# Patient Record
Sex: Male | Born: 1950 | Race: Black or African American | Hispanic: No | Smoking: Former smoker
Health system: Southern US, Community
[De-identification: ages and names within clinical notes are randomized; demographics above are authoritative.]

## PROBLEM LIST (undated history)

## (undated) DIAGNOSIS — M1990 Unspecified osteoarthritis, unspecified site: Secondary | ICD-10-CM

## (undated) DIAGNOSIS — I10 Essential (primary) hypertension: Secondary | ICD-10-CM

## (undated) DIAGNOSIS — E119 Type 2 diabetes mellitus without complications: Secondary | ICD-10-CM

## (undated) DIAGNOSIS — K4090 Unilateral inguinal hernia, without obstruction or gangrene, not specified as recurrent: Secondary | ICD-10-CM

## (undated) DIAGNOSIS — E785 Hyperlipidemia, unspecified: Secondary | ICD-10-CM

## (undated) DIAGNOSIS — M109 Gout, unspecified: Secondary | ICD-10-CM

## (undated) HISTORY — DX: Unspecified osteoarthritis, unspecified site: M19.90

## (undated) HISTORY — DX: Essential (primary) hypertension: I10

## (undated) HISTORY — DX: Hyperlipidemia, unspecified: E78.5

## (undated) HISTORY — DX: Gout, unspecified: M10.9

## (undated) HISTORY — DX: Type 2 diabetes mellitus without complications: E11.9

---

## 2006-06-09 ENCOUNTER — Encounter: Admission: RE | Admit: 2006-06-09 | Discharge: 2006-06-09 | Payer: Self-pay | Admitting: Family Medicine

## 2006-09-24 ENCOUNTER — Encounter: Admission: RE | Admit: 2006-09-24 | Discharge: 2006-09-24 | Payer: Self-pay | Admitting: Family Medicine

## 2011-06-01 ENCOUNTER — Ambulatory Visit (INDEPENDENT_AMBULATORY_CARE_PROVIDER_SITE_OTHER): Payer: Managed Care, Other (non HMO)

## 2011-06-01 DIAGNOSIS — M79609 Pain in unspecified limb: Secondary | ICD-10-CM

## 2011-06-01 DIAGNOSIS — I10 Essential (primary) hypertension: Secondary | ICD-10-CM

## 2011-12-08 ENCOUNTER — Encounter: Payer: Self-pay | Admitting: Internal Medicine

## 2012-01-05 ENCOUNTER — Ambulatory Visit (AMBULATORY_SURGERY_CENTER): Payer: Self-pay

## 2012-01-05 ENCOUNTER — Encounter: Payer: Self-pay | Admitting: Internal Medicine

## 2012-01-05 VITALS — Ht 71.0 in | Wt 210.0 lb

## 2012-01-05 DIAGNOSIS — Z1211 Encounter for screening for malignant neoplasm of colon: Secondary | ICD-10-CM

## 2012-01-05 MED ORDER — MOVIPREP 100 G PO SOLR: 1.0000 | Freq: Once | ORAL | Status: DC

## 2012-01-06 ENCOUNTER — Ambulatory Visit (INDEPENDENT_AMBULATORY_CARE_PROVIDER_SITE_OTHER): Payer: Managed Care, Other (non HMO) | Admitting: Emergency Medicine

## 2012-01-06 VITALS — BP 118/72 | HR 81 | Temp 98.7°F | Resp 18 | Ht 70.0 in | Wt 208.0 lb

## 2012-01-06 DIAGNOSIS — M109 Gout, unspecified: Secondary | ICD-10-CM

## 2012-01-06 MED ORDER — COLCHICINE 0.6 MG PO TABS: 0.6000 mg | ORAL_TABLET | Freq: Every day | ORAL | Status: DC

## 2012-01-06 NOTE — Progress Notes (Signed)
   Date:  01/06/2012   Name:  Curtis Allison   DOB:  06-29-1950   MRN:  [id number]  PCP:  Gaspar Garbe, MD    Chief Complaint: Gout   History of Present Illness:  Curtis Allison is a 61 y.o. very pleasant male patient who presents with the following:  History of gout and usually responds well to colcrys.  Has pain, redness, and swelling in right wrist.  No history of injury or fever or chills.  No deformity.  There is no problem list on file for this patient.   Past Medical History  Diagnosis Date  . Hypertension     diagnosed 2006  . Diabetes mellitus     type II ; diagnosed 2006  . Gout     No past surgical history on file.  History  Substance Use Topics  . Smoking status: Former Smoker    Quit date: 01/04/2009  . Smokeless tobacco: Never Used  . Alcohol Use: Not on file    Family History  Problem Relation Age of Onset  . Colon cancer Maternal Grandfather     No Known Allergies  Medication list has been reviewed and updated.  Current Outpatient Prescriptions on File Prior to Visit  Medication Sig Dispense Refill  . amLODipine (NORVASC) 10 MG tablet Take 10 mg by mouth daily.      Marland Kitchen atenolol (TENORMIN) 50 MG tablet Take 50 mg by mouth daily.      . colchicine 0.6 MG tablet Take 0.6 mg by mouth daily.      Marland Kitchen lisinopril (PRINIVIL,ZESTRIL) 20 MG tablet Take 20 mg by mouth daily.      . metFORMIN (GLUCOPHAGE) 500 MG tablet Take 500 mg by mouth 2 (two) times daily with a meal.      . TRILIPIX 135 MG capsule       . MOVIPREP 100 G SOLR Take 1 kit (100 g total) by mouth once.  1 kit  0    Review of Systems:  As per HPI, otherwise negative.    Physical Examination: Filed Vitals:   01/06/12 1724  BP: 118/72  Pulse: 81  Temp: 98.7 F (37.1 C)  Resp: 18   Filed Vitals:   01/06/12 1724  Height: 5\' 10"  (1.778 m)  Weight: 208 lb (94.348 kg)   Body mass index is 29.84 kg/(m^2). Ideal Body Weight: Weight in (lb) to have BMI = 25: 173.9    GEN:  WDWN, NAD, Non-toxic, Alert & Oriented x 3 HEENT: Atraumatic, Normocephalic.  Ears and Nose: No external deformity. EXTR: No clubbing/cyanosis/edema.  Right wrist swollen, red, guards, and very tender.  NATI.  No deformity NEURO: Normal gait.  PSYCH: Normally interactive. Conversant. Not depressed or anxious appearing.  Calm demeanor.    Assessment and Plan: Gout exacerbation colcrys  Carmelina Dane, MD

## 2012-01-06 NOTE — Progress Notes (Deleted)
  Subjective:    Patient ID: Curtis Allison, male    DOB: 12/24/1950, 61 y.o.   MRN: [id number]  HPI    Review of Systems     Objective:   Physical Exam        Assessment & Plan:

## 2012-01-06 NOTE — Patient Instructions (Addendum)
gGout Gout is an inflammatory condition (arthritis) caused by a buildup of uric acid crystals in the joints. Uric acid is a chemical that is normally present in the blood. Under some circumstances, uric acid can form into crystals in your joints. This causes joint redness, soreness, and swelling (inflammation). Repeat attacks are common. Over time, uric acid crystals can form into masses (tophi) near a joint, causing disfigurement. Gout is treatable and often preventable. CAUSES  The disease begins with elevated levels of uric acid in the blood. Uric acid is produced by your body when it breaks down a naturally found substance called purines. This also happens when you eat certain foods such as meats and fish. Causes of an elevated uric acid level include:  Being passed down from parent to child (heredity).   Diseases that cause increased uric acid production (obesity, psoriasis, some cancers).   Excessive alcohol use.   Diet, especially diets rich in meat and seafood.   Medicines, including certain cancer-fighting drugs (chemotherapy), diuretics, and aspirin.   Chronic kidney disease. The kidneys are no longer able to remove uric acid well.   Problems with metabolism.  Conditions strongly associated with gout include:  Obesity.   High blood pressure.   High cholesterol.   Diabetes.  Not everyone with elevated uric acid levels gets gout. It is not understood why some people get gout and others do not. Surgery, joint injury, and eating too much of certain foods are some of the factors that can lead to gout. SYMPTOMS   An attack of gout comes on quickly. It causes intense pain with redness, swelling, and warmth in a joint.   Fever can occur.   Often, only one joint is involved. Certain joints are more commonly involved:   Base of the big toe.   Knee.   Ankle.   Wrist.   Finger.  Without treatment, an attack usually goes away in a few days to weeks. Between attacks, you  usually will not have symptoms, which is different from many other forms of arthritis. DIAGNOSIS  Your caregiver will suspect gout based on your symptoms and exam. Removal of fluid from the joint (arthrocentesis) is done to check for uric acid crystals. Your caregiver will give you a medicine that numbs the area (local anesthetic) and use a needle to remove joint fluid for exam. Gout is confirmed when uric acid crystals are seen in joint fluid, using a special microscope. Sometimes, blood, urine, and X-ray tests are also used. TREATMENT  There are 2 phases to gout treatment: treating the sudden onset (acute) attack and preventing attacks (prophylaxis). Treatment of an Acute Attack  Medicines are used. These include anti-inflammatory medicines or steroid medicines.   An injection of steroid medicine into the affected joint is sometimes necessary.   The painful joint is rested. Movement can worsen the arthritis.   You may use warm or cold treatments on painful joints, depending which works best for you.   Discuss the use of coffee, vitamin C, or cherries with your caregiver. These may be helpful treatment options.  Treatment to Prevent Attacks After the acute attack subsides, your caregiver may advise prophylactic medicine. These medicines either help your kidneys eliminate uric acid from your body or decrease your uric acid production. You may need to stay on these medicines for a very long time. The early phase of treatment with prophylactic medicine can be associated with an increase in acute gout attacks. For this reason, during the first few months  of treatment, your caregiver may also advise you to take medicines usually used for acute gout treatment. Be sure you understand your caregiver's directions. You should also discuss dietary treatment with your caregiver. Certain foods such as meats and fish can increase uric acid levels. Other foods such as dairy can decrease levels. Your caregiver  can give you a list of foods to avoid. HOME CARE INSTRUCTIONS   Do not take aspirin to relieve pain. This raises uric acid levels.   Only take over-the-counter or prescription medicines for pain, discomfort, or fever as directed by your caregiver.   Rest the joint as much as possible. When in bed, keep sheets and blankets off painful areas.   Keep the affected joint raised (elevated).   Use crutches if the painful joint is in your leg.   Drink enough water and fluids to keep your urine clear or pale yellow. This helps your body get rid of uric acid. Do not drink alcoholic beverages. They slow the passage of uric acid.   Follow your caregiver's dietary instructions. Pay careful attention to the amount of protein you eat. Your daily diet should emphasize fruits, vegetables, whole grains, and fat-free or low-fat milk products.   Maintain a healthy body weight.  SEEK MEDICAL CARE IF:   You have an oral temperature above 102 F (38.9 C).   You develop diarrhea, vomiting, or any side effects from medicines.   You do not feel better in 24 hours, or you are getting worse.  SEEK IMMEDIATE MEDICAL CARE IF:   Your joint becomes suddenly more tender and you have:   Chills.   An oral temperature above 102 F (38.9 C), not controlled by medicine.  MAKE SURE YOU:   Understand these instructions.   Will watch your condition.   Will get help right away if you are not doing well or get worse.  Document Released: 05/28/2000 Document Revised: 05/20/2011 Document Reviewed: 09/08/2009 Exit[employer] Patient Information 2012 [employer]. 

## 2012-01-19 ENCOUNTER — Encounter: Payer: Self-pay | Admitting: Internal Medicine

## 2012-01-21 ENCOUNTER — Ambulatory Visit (AMBULATORY_SURGERY_CENTER): Payer: Managed Care, Other (non HMO) | Admitting: Internal Medicine

## 2012-01-21 ENCOUNTER — Encounter: Payer: Self-pay | Admitting: Internal Medicine

## 2012-01-21 VITALS — BP 160/94 | HR 85 | Temp 97.0°F | Resp 20 | Ht 71.0 in | Wt 210.0 lb

## 2012-01-21 DIAGNOSIS — D126 Benign neoplasm of colon, unspecified: Secondary | ICD-10-CM

## 2012-01-21 DIAGNOSIS — Z1211 Encounter for screening for malignant neoplasm of colon: Secondary | ICD-10-CM

## 2012-01-21 MED ORDER — SODIUM CHLORIDE 0.9 % IV SOLN: 500.0000 mL | INTRAVENOUS | Status: DC

## 2012-01-21 NOTE — Progress Notes (Signed)
Patient did not experience any of the following events: a burn prior to discharge; a fall within the facility; wrong site/side/patient/procedure/implant event; or a hospital transfer or hospital admission upon discharge from the facility. (G8907) Patient did not have preoperative order for IV antibiotic SSI prophylaxis. (G8918)  

## 2012-01-21 NOTE — Op Note (Signed)
[facility] [address] [place], [place]  [address]  COLONOSCOPY PROCEDURE REPORT  PATIENT:  Curtis Allison, Curtis Allison  MR#:  [id number] BIRTHDATE:  17-Feb-1951, 60 yrs. old  GENDER:  male ENDOSCOPIST:  Iva Boop, MD, [facility] REF. BY:  Guerry Bruin, M.D. PROCEDURE DATE:  01/21/2012 PROCEDURE:  Colonoscopy with snare polypectomy ASA CLASS:  Class II INDICATIONS:  Routine Risk Screening MEDICATIONS:   These medications were titrated to patient response per physician's verbal order, MAC sedation, administered by CRNA, propofol (Diprivan) 300 mg IV  DESCRIPTION OF PROCEDURE:   After the risks benefits and alternatives of the procedure were thoroughly explained, informed consent was obtained.  Digital rectal exam was performed and revealed no abnormalities and normal prostate.   The LB [id number] endoscope was introduced through the anus and advanced to the cecum, which was identified by both the appendix and ileocecal valve, without limitations.  The quality of the prep was excellent, using MoviPrep.  The instrument was then slowly withdrawn as the colon was fully examined. <<PROCEDUREIMAGES>>  FINDINGS:  A diminutive polyp was found in the descending colon. It was 4 mm in size.  This was otherwise a normal examination of the colon. Includes right colon retroflexion.   Retroflexed views in the rectum revealed no abnormalities.    The time to cecum = 1:37 minutes. The scope was then withdrawn in 10:57 minutes from the cecum and the procedure completed. COMPLICATIONS:  None ENDOSCOPIC IMPRESSION: 1) 4 mm diminutive polyp in the descending colon - removed 2) Otherwise normal examination - excellent prep  REPEAT EXAM:  In for Colonoscopy, pending biopsy results.  Iva Boop, MD, [facility]  CC:  Guerry Bruin, MD and The Patient  n. eSIGNED:   Iva Boop at 01/21/2012 02:06 PM  Charlaine Dalton, [phone]

## 2012-01-21 NOTE — Patient Instructions (Addendum)
One small polyp was removed. It looks benign. The colon was otherwise normal.  I will send a letter about the results and recommendations.  Thank you for choosing me and [facility].  Iva Boop, MD, FACG  YOU HAD AN ENDOSCOPIC PROCEDURE TODAY AT THE  ENDOSCOPY CENTER: Refer to the procedure report that was given to you for any specific questions about what was found during the examination.  If the procedure report does not answer your questions, please call your gastroenterologist to clarify.  If you requested that your care partner not be given the details of your procedure findings, then the procedure report has been included in a sealed envelope for you to review at your convenience later.  YOU SHOULD EXPECT: Some feelings of bloating in the abdomen. Passage of more gas than usual.  Walking can help get rid of the air that was put into your GI tract during the procedure and reduce the bloating. If you had a lower endoscopy (such as a colonoscopy or flexible sigmoidoscopy) you may notice spotting of blood in your stool or on the toilet paper. If you underwent a bowel prep for your procedure, then you may not have a normal bowel movement for a few days.  DIET: Your first meal following the procedure should be a light meal and then it is ok to progress to your normal diet.  A half-sandwich or bowl of soup is an example of a good first meal.  Heavy or fried foods are harder to digest and may make you feel nauseous or bloated.  Likewise meals heavy in dairy and vegetables can cause extra gas to form and this can also increase the bloating.  Drink plenty of fluids but you should avoid alcoholic beverages for 24 hours.  ACTIVITY: Your care partner should take you home directly after the procedure.  You should plan to take it easy, moving slowly for the rest of the day.  You can resume normal activity the day after the procedure however you should NOT DRIVE or use heavy machinery  for 24 hours (because of the sedation medicines used during the test).    SYMPTOMS TO REPORT IMMEDIATELY: A gastroenterologist can be reached at any hour.  During normal business hours, 8:30 AM to 5:00 PM Monday through Friday, call [phone].  After hours and on weekends, please call the GI answering service at [phone] who will take a message and have the physician on call contact you.   Following lower endoscopy (colonoscopy or flexible sigmoidoscopy):  Excessive amounts of blood in the stool  Significant tenderness or worsening of abdominal pains  Swelling of the abdomen that is new, acute  Fever of 100F or higher  Following upper endoscopy (EGD)  Vomiting of blood or coffee ground material  New chest pain or pain under the shoulder blades  Painful or persistently difficult swallowing  New shortness of breath  Fever of 100F or higher  Black, tarry-looking stools  FOLLOW UP: If any biopsies were taken you will be contacted by phone or by letter within the next 1-3 weeks.  Call your gastroenterologist if you have not heard about the biopsies in 3 weeks.  Our staff will call the home number listed on your records the next business day following your procedure to check on you and address any questions or concerns that you may have at that time regarding the information given to you following your procedure. This is a courtesy call and so if there  is no answer at the home number and we have not heard from you through the emergency physician on call, we will assume that you have returned to your regular daily activities without incident.  SIGNATURES/CONFIDENTIALITY: You and/or your care partner have signed paperwork which will be entered into your electronic medical record.  These signatures attest to the fact that that the information above on your After Visit Summary has been reviewed and is understood.  Full responsibility of the confidentiality of this discharge information  lies with you and/or your care-partner.   HANDOUTS ON POLYPS

## 2012-01-24 ENCOUNTER — Telehealth: Payer: Self-pay | Admitting: *Deleted

## 2012-01-24 NOTE — Telephone Encounter (Signed)
  Follow up Call-  Call back number 01/21/2012  Post procedure Call Back phone  # [phone]  Permission to leave phone message Yes     Patient questions:  Message left to call us if necessary.

## 2012-01-26 ENCOUNTER — Encounter: Payer: Self-pay | Admitting: Internal Medicine

## 2012-01-26 DIAGNOSIS — Z860100 Personal history of colon polyps, unspecified: Secondary | ICD-10-CM | POA: Insufficient documentation

## 2012-01-26 NOTE — Progress Notes (Signed)
Quick Note:  Diminutive adenoma  Repeat colonoscopy about 01/2017 ______

## 2012-03-17 ENCOUNTER — Other Ambulatory Visit: Payer: Self-pay | Admitting: Emergency Medicine

## 2013-03-20 ENCOUNTER — Ambulatory Visit (INDEPENDENT_AMBULATORY_CARE_PROVIDER_SITE_OTHER): Payer: Commercial Indemnity | Admitting: General Surgery

## 2013-03-20 ENCOUNTER — Encounter (INDEPENDENT_AMBULATORY_CARE_PROVIDER_SITE_OTHER): Payer: Self-pay | Admitting: General Surgery

## 2013-03-20 ENCOUNTER — Telehealth (INDEPENDENT_AMBULATORY_CARE_PROVIDER_SITE_OTHER): Payer: Self-pay | Admitting: General Surgery

## 2013-03-20 VITALS — BP 164/96 | HR 85 | Temp 98.2°F | Ht 71.0 in | Wt 220.0 lb

## 2013-03-20 DIAGNOSIS — K4090 Unilateral inguinal hernia, without obstruction or gangrene, not specified as recurrent: Secondary | ICD-10-CM

## 2013-03-20 NOTE — Progress Notes (Signed)
Patient ID: Curtis Allison, male   DOB: 11-24-1950, 62 y.o.   MRN: [id number]  Chief Complaint  Patient presents with  . New Evaluation    eval RIH    HPI Curtis Allison is a 62 y.o. male.  The patient is a 62 year old male who is referred by Dr. Wylene Simmer for evaluation of a right inguinal hernia. The patient states he feels she's had this for approximately one to 2 years. He states becoming more discomforting. 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  HPI  Past Medical History  Diagnosis Date  . Hypertension     diagnosed 2006  . Diabetes mellitus     type II ; diagnosed 2006  . Gout     History reviewed. No pertinent past surgical history.  Family History  Problem Relation Age of Onset  . Colon cancer Maternal Grandfather   . Diabetes Mother   . Hypertension Mother   . Diabetes Sister   . Hypertension Sister   . Heart disease Father     Social History History  Substance Use Topics  . Smoking status: Former Smoker    Quit date: 01/04/2009  . Smokeless tobacco: Never Used  . Alcohol Use: 2.0 oz/week    4 drink(s) per week    Allergies  Allergen Reactions  . Penicillins Itching    Current Outpatient Prescriptions  Medication Sig Dispense Refill  . Alogliptin-Pioglitazone (OSENI) 25-15 MG TABS Take by mouth.      Marland Kitchen amLODipine (NORVASC) 10 MG tablet Take 10 mg by mouth daily.      Marland Kitchen atenolol (TENORMIN) 50 MG tablet Take 50 mg by mouth daily.      Marland Kitchen COLCRYS 0.6 MG tablet TAKE TWO TABLETS NOW AND ONE IN ONE HOUR.  Then starting TOMORROW TAKING ONE TABLET DAILY  30 tablet  0  . lisinopril (PRINIVIL,ZESTRIL) 20 MG tablet Take 20 mg by mouth daily.      . TRILIPIX 135 MG capsule       . metFORMIN (GLUCOPHAGE) 500 MG tablet Take 500 mg by mouth 2 (two) times daily with a meal.       No current facility-administered medications for this visit.    Review  of Systems Review of Systems  Constitutional: Negative.   HENT: Negative.   Eyes: Negative.   Respiratory: Negative.   Cardiovascular: Negative.   Gastrointestinal: Negative.   Neurological: Negative.   All other systems reviewed and are negative.    Blood pressure 164/96, pulse 85, temperature 98.2 F (36.8 C), temperature source Temporal, height 5\' 11"  (1.803 m), weight 220 lb (99.791 kg), SpO2 98.00%.  Physical Exam Physical Exam  Constitutional: He is oriented to person, place, and time. He appears well-developed and well-nourished.  HENT:  Head: Normocephalic and atraumatic.  Eyes: Conjunctivae and EOM are normal. Pupils are equal, round, and reactive to light.  Neck: Normal range of motion. Neck supple.  Cardiovascular: Normal rate, regular rhythm and normal heart sounds.   Pulmonary/Chest: Effort normal and breath sounds normal.  Abdominal: Soft. Bowel sounds are normal. There is no tenderness. There is no rebound and no guarding. A hernia is present. Hernia confirmed positive in the right inguinal area. Hernia confirmed negative in the left inguinal area.  Musculoskeletal: Normal range of motion.  Neurological: He is alert and oriented to person, place, and time.    Data Reviewed none  Assessment    62 year old male with a right inguinal hernia     Plan    1. We'll proceed to the operating room for laparoscopic right inguinal hernia repair with mesh 2.All risks and benefits were discussed with the patient, to generally include infection, bleeding, damage to surrounding structures, acute and chronic nerve pain, and recurrence. Alternatives were offered and described.  All questions were answered and the patient voiced understanding of the procedure and wishes to proceed at this point.         Marigene Ehlers., Berdena Cisek 03/20/2013, 1:40 PM

## 2013-03-20 NOTE — Telephone Encounter (Signed)
Went over pt financial responsibilities he will call back to schedule. placed in pending folder.

## 2014-03-14 ENCOUNTER — Ambulatory Visit (INDEPENDENT_AMBULATORY_CARE_PROVIDER_SITE_OTHER): Payer: Self-pay | Admitting: General Surgery

## 2014-03-25 NOTE — Patient Instructions (Addendum)
Curtis Allison  03/25/2014                           YOUR PROCEDURE IS SCHEDULED ON:  04/01/14                ENTER FROM [address] - GO TO [address]ARKING DECK               LOOK FOR [employer]  / [employer]                              FOLLOW  SIGNS TO SHORT STAY CENTER                 ARRIVE AT SHORT STAY AT: 9:45 AM               CALL THIS NUMBER IF ANY PROBLEMS THE DAY OF SURGERY :               [phone]                                REMEMBER:   Do not eat food or drink liquids AFTER MIDNIGHT                  Take these medicines the morning of surgery with               A SIPS OF WATER :    ATENOLOL     Do not wear jewelry, make-up   Do not wear lotions, powders, or perfumes.   Do not shave legs or underarms 12 hrs. before surgery (men may shave face)  Do not bring valuables to the hospital.  Contacts, dentures or bridgework may not be worn into surgery.  Leave suitcase in the car. After surgery it may be brought to your room.  For patients admitted to the hospital more than one night, checkout time is            11:00 AM                                                       The day of discharge.   Patients discharged the day of surgery will not be allowed to drive home.            If going home same day of surgery, must have someone stay with you              FIRST 24 hrs at home and arrange for some one to drive you              home from hospital.   ________________________________________________________________________  [place]  Before surgery, you can play an important role.  Because skin is not sterile, your skin needs to be as free of germs as possible.  You can reduce the number of germs on your skin by washing with CHG (chlorahexidine gluconate) soap before surgery.  CHG is an antiseptic cleaner which kills germs and  bonds with the skin to continue killing germs even after washing. Please DO NOT use if you have an allergy to CHG or antibacterial soaps.  If your skin becomes reddened/irritated stop using the CHG and inform your nurse when you arrive at Short Stay. Do not shave (including legs and underarms) for at least 48 hours prior to the first CHG shower.  You may shave your face. Please follow these instructions carefully:   1.  Shower with CHG Soap the night before surgery and the  morning of Surgery.   2.  If you choose to wash your hair, wash your hair first as usual with your  normal  Shampoo.   3.  After you shampoo, rinse your hair and body thoroughly to remove the  shampoo.                                         4.  Use CHG as you would any other liquid soap.  You can apply chg directly  to the skin and wash . Gently wash with scrungie or clean wascloth    5.  Apply the CHG Soap to your body ONLY FROM THE NECK DOWN.   Do not use on open                           Wound or open sores. Avoid contact with eyes, ears mouth and genitals (private parts).                        Genitals (private parts) with your normal soap.              6.  Wash thoroughly, paying special attention to the area where your surgery  will be performed.   7.  Thoroughly rinse your body with warm water from the neck down.   8.  DO NOT shower/wash with your normal soap after using and rinsing off  the CHG Soap .                9.  Pat yourself dry with a clean towel.             10.  Wear clean pajamas.             11.  Place clean sheets on your bed the night of your first shower and do not  sleep with pets.  Day of Surgery : Do not apply any lotions/deodorants the morning of surgery.  Please wear clean clothes to the hospital/surgery center.  FAILURE TO FOLLOW THESE INSTRUCTIONS MAY RESULT IN THE CANCELLATION OF YOUR SURGERY    PATIENT  SIGNATURE_________________________________  ______________________________________________________________________

## 2014-03-26 ENCOUNTER — Ambulatory Visit (HOSPITAL_COMMUNITY)
Admission: RE | Admit: 2014-03-26 | Discharge: 2014-03-26 | Disposition: A | Discharge status: Home / Self Care | Payer: Managed Care, Other (non HMO) | Source: Ambulatory Visit | Attending: Anesthesiology | Admitting: Anesthesiology

## 2014-03-26 ENCOUNTER — Encounter (HOSPITAL_COMMUNITY)
Admission: RE | Admit: 2014-03-26 | Discharge: 2014-03-26 | Disposition: A | Discharge status: Home / Self Care | Payer: Managed Care, Other (non HMO) | Source: Ambulatory Visit | Attending: General Surgery | Admitting: General Surgery

## 2014-03-26 ENCOUNTER — Encounter (HOSPITAL_COMMUNITY): Payer: Self-pay

## 2014-03-26 ENCOUNTER — Encounter (HOSPITAL_COMMUNITY): Payer: Self-pay | Admitting: Pharmacy Technician

## 2014-03-26 DIAGNOSIS — K469 Unspecified abdominal hernia without obstruction or gangrene: Secondary | ICD-10-CM | POA: Insufficient documentation

## 2014-03-26 DIAGNOSIS — I10 Essential (primary) hypertension: Secondary | ICD-10-CM

## 2014-03-26 DIAGNOSIS — Z01818 Encounter for other preprocedural examination: Secondary | ICD-10-CM | POA: Diagnosis present

## 2014-03-26 HISTORY — DX: Unilateral inguinal hernia, without obstruction or gangrene, not specified as recurrent: K40.90

## 2014-03-26 LAB — CBC
HCT: 42.4 % (ref 39.0–52.0)
HEMOGLOBIN: 13.9 g/dL (ref 13.0–17.0)
MCH: 30.5 pg (ref 26.0–34.0)
MCHC: 32.8 g/dL (ref 30.0–36.0)
MCV: 93.2 fL (ref 78.0–100.0)
Platelets: 235 10*3/uL (ref 150–400)
RBC: 4.55 MIL/uL (ref 4.22–5.81)
RDW: 13.8 % (ref 11.5–15.5)
WBC: 9.6 10*3/uL (ref 4.0–10.5)

## 2014-03-26 LAB — BASIC METABOLIC PANEL
Anion gap: 14 (ref 5–15)
BUN: 12 mg/dL (ref 6–23)
CALCIUM: 9.3 mg/dL (ref 8.4–10.5)
CHLORIDE: 104 meq/L (ref 96–112)
CO2: 24 meq/L (ref 19–32)
CREATININE: 1.01 mg/dL (ref 0.50–1.35)
GFR, EST NON AFRICAN AMERICAN: 78 mL/min — AB (ref >90)
GLUCOSE: 144 mg/dL — AB (ref 70–99)
Potassium: 4.5 mEq/L (ref 3.7–5.3)
SODIUM: 142 meq/L (ref 137–147)

## 2014-03-26 NOTE — Progress Notes (Signed)
03/26/14 0825  OBSTRUCTIVE SLEEP APNEA  Have you ever been diagnosed with sleep apnea through a sleep study? No  Do you snore loudly (loud enough to be heard through closed doors)?  1  Do you often feel tired, fatigued, or sleepy during the daytime? 0  Has anyone observed you stop breathing during your sleep? 1  Do you have, or are you being treated for high blood pressure? 1  BMI more than 35 kg/m2? 0  Age over 62 years old? 1  Neck circumference greater than 40 cm/16 inches? 1  Gender: 1  Obstructive Sleep Apnea Score 6  Score 4 or greater  Results sent to PCP

## 2014-04-01 ENCOUNTER — Ambulatory Visit (HOSPITAL_COMMUNITY)
Admission: RE | Admit: 2014-04-01 | Discharge: 2014-04-01 | Disposition: A | Discharge status: Home / Self Care | Payer: Managed Care, Other (non HMO) | Source: Ambulatory Visit | Attending: General Surgery | Admitting: General Surgery

## 2014-04-01 ENCOUNTER — Encounter (HOSPITAL_COMMUNITY): Payer: Self-pay | Admitting: *Deleted

## 2014-04-01 ENCOUNTER — Encounter (HOSPITAL_COMMUNITY): Payer: Managed Care, Other (non HMO) | Admitting: Anesthesiology

## 2014-04-01 ENCOUNTER — Encounter (HOSPITAL_COMMUNITY)
Admission: RE | Disposition: A | Discharge status: Home / Self Care | Payer: Self-pay | Source: Ambulatory Visit | Attending: General Surgery

## 2014-04-01 ENCOUNTER — Ambulatory Visit (HOSPITAL_COMMUNITY): Payer: Managed Care, Other (non HMO) | Admitting: Anesthesiology

## 2014-04-01 DIAGNOSIS — E780 Pure hypercholesterolemia: Secondary | ICD-10-CM | POA: Insufficient documentation

## 2014-04-01 DIAGNOSIS — Z87891 Personal history of nicotine dependence: Secondary | ICD-10-CM | POA: Diagnosis not present

## 2014-04-01 DIAGNOSIS — I10 Essential (primary) hypertension: Secondary | ICD-10-CM | POA: Insufficient documentation

## 2014-04-01 DIAGNOSIS — E118 Type 2 diabetes mellitus with unspecified complications: Secondary | ICD-10-CM | POA: Diagnosis not present

## 2014-04-01 DIAGNOSIS — K4090 Unilateral inguinal hernia, without obstruction or gangrene, not specified as recurrent: Secondary | ICD-10-CM | POA: Insufficient documentation

## 2014-04-01 HISTORY — PX: INGUINAL HERNIA REPAIR: SHX194

## 2014-04-01 LAB — GLUCOSE, CAPILLARY
GLUCOSE-CAPILLARY: 125 mg/dL — AB (ref 70–99)
Glucose-Capillary: 155 mg/dL — ABNORMAL HIGH (ref 70–99)

## 2014-04-01 SURGERY — REPAIR, HERNIA, INGUINAL, LAPAROSCOPIC
Anesthesia: General | Site: Groin | Laterality: Right

## 2014-04-01 MED ORDER — FENTANYL CITRATE 0.05 MG/ML IJ SOLN
25.0000 ug | INTRAMUSCULAR | Status: DC | PRN
  Administered 2014-04-01 (×2): 25 ug via INTRAVENOUS

## 2014-04-01 MED ORDER — OXYCODONE-ACETAMINOPHEN 5-325 MG PO TABS: 1.0000 | ORAL_TABLET | ORAL | Status: DC | PRN

## 2014-04-01 MED ORDER — LIDOCAINE HCL (CARDIAC) 20 MG/ML IV SOLN
INTRAVENOUS | Status: AC
  Filled 2014-04-01: qty 5

## 2014-04-01 MED ORDER — GLYCOPYRROLATE 0.2 MG/ML IJ SOLN
INTRAMUSCULAR | Status: DC | PRN
  Administered 2014-04-01: .7 mg via INTRAVENOUS

## 2014-04-01 MED ORDER — NEOSTIGMINE METHYLSULFATE 10 MG/10ML IV SOLN
INTRAVENOUS | Status: DC | PRN
  Administered 2014-04-01: 5 mg via INTRAVENOUS

## 2014-04-01 MED ORDER — ROCURONIUM BROMIDE 100 MG/10ML IV SOLN
INTRAVENOUS | Status: DC | PRN
  Administered 2014-04-01: 50 mg via INTRAVENOUS
  Administered 2014-04-01: 10 mg via INTRAVENOUS

## 2014-04-01 MED ORDER — LIDOCAINE HCL (CARDIAC) 20 MG/ML IV SOLN
INTRAVENOUS | Status: DC | PRN
  Administered 2014-04-01: 60 mg via INTRAVENOUS

## 2014-04-01 MED ORDER — LACTATED RINGERS IV SOLN
INTRAVENOUS | Status: DC | PRN
  Administered 2014-04-01 (×2): via INTRAVENOUS

## 2014-04-01 MED ORDER — BUPIVACAINE-EPINEPHRINE 0.25% -1:200000 IJ SOLN
INTRAMUSCULAR | Status: DC | PRN
  Administered 2014-04-01: 10 mL

## 2014-04-01 MED ORDER — MIDAZOLAM HCL 5 MG/5ML IJ SOLN
INTRAMUSCULAR | Status: DC | PRN
  Administered 2014-04-01: 2 mg via INTRAVENOUS

## 2014-04-01 MED ORDER — ONDANSETRON HCL 4 MG/2ML IJ SOLN: 4.0000 mg | Freq: Once | INTRAMUSCULAR | Status: DC | PRN

## 2014-04-01 MED ORDER — FENTANYL CITRATE 0.05 MG/ML IJ SOLN
INTRAMUSCULAR | Status: AC
  Filled 2014-04-01: qty 2

## 2014-04-01 MED ORDER — FENTANYL CITRATE 0.05 MG/ML IJ SOLN
INTRAMUSCULAR | Status: DC | PRN
  Administered 2014-04-01 (×2): 100 ug via INTRAVENOUS

## 2014-04-01 MED ORDER — ONDANSETRON HCL 4 MG/2ML IJ SOLN
INTRAMUSCULAR | Status: DC | PRN
  Administered 2014-04-01: 4 mg via INTRAVENOUS

## 2014-04-01 MED ORDER — VANCOMYCIN HCL 10 G IV SOLR
1500.0000 mg | Freq: Once | INTRAVENOUS | Status: AC
  Administered 2014-04-01: 1500 mg via INTRAVENOUS
  Filled 2014-04-01: qty 1500

## 2014-04-01 MED ORDER — ROCURONIUM BROMIDE 100 MG/10ML IV SOLN
INTRAVENOUS | Status: AC
  Filled 2014-04-01: qty 1

## 2014-04-01 MED ORDER — MIDAZOLAM HCL 2 MG/2ML IJ SOLN
INTRAMUSCULAR | Status: AC
  Filled 2014-04-01: qty 2

## 2014-04-01 MED ORDER — FENTANYL CITRATE 0.05 MG/ML IJ SOLN
INTRAMUSCULAR | Status: AC
  Filled 2014-04-01: qty 5

## 2014-04-01 MED ORDER — VANCOMYCIN HCL IN DEXTROSE 1-5 GM/200ML-% IV SOLN: 1000.0000 mg | INTRAVENOUS | Status: DC

## 2014-04-01 MED ORDER — ONDANSETRON HCL 4 MG/2ML IJ SOLN
INTRAMUSCULAR | Status: AC
  Filled 2014-04-01: qty 2

## 2014-04-01 MED ORDER — 0.9 % SODIUM CHLORIDE (POUR BTL) OPTIME
TOPICAL | Status: DC | PRN
  Administered 2014-04-01: 1000 mL

## 2014-04-01 MED ORDER — LACTATED RINGERS IV SOLN
INTRAVENOUS | Status: DC
  Administered 2014-04-01: 1000 mL via INTRAVENOUS

## 2014-04-01 MED ORDER — BUPIVACAINE-EPINEPHRINE (PF) 0.25% -1:200000 IJ SOLN
INTRAMUSCULAR | Status: AC
  Filled 2014-04-01: qty 30

## 2014-04-01 MED ORDER — PROPOFOL 10 MG/ML IV BOLUS
INTRAVENOUS | Status: AC
  Filled 2014-04-01: qty 20

## 2014-04-01 MED ORDER — CHLORHEXIDINE GLUCONATE 4 % EX LIQD: 1.0000 "application " | Freq: Once | CUTANEOUS | Status: DC

## 2014-04-01 MED ORDER — PROPOFOL 10 MG/ML IV BOLUS
INTRAVENOUS | Status: DC | PRN
  Administered 2014-04-01: 150 mg via INTRAVENOUS

## 2014-04-01 SURGICAL SUPPLY — 36 items
BENZOIN TINCTURE PRP APPL 2/3 (GAUZE/BANDAGES/DRESSINGS) ×3 IMPLANT
CABLE HIGH FREQUENCY MONO STRZ (ELECTRODE) ×3 IMPLANT
CATH FOLEY 3WAY  5CC 16FR (CATHETERS) ×2
CATH FOLEY 3WAY 5CC 16FR (CATHETERS) ×1 IMPLANT
CLOSURE STERI-STRIP 1/4X4 (GAUZE/BANDAGES/DRESSINGS) ×3 IMPLANT
CLOSURE WOUND 1/2 X4 (GAUZE/BANDAGES/DRESSINGS) ×1
DECANTER SPIKE VIAL GLASS SM (MISCELLANEOUS) IMPLANT
DRAPE LAPAROSCOPIC ABDOMINAL (DRAPES) IMPLANT
DRSG TEGADERM 2-3/8X2-3/4 SM (GAUZE/BANDAGES/DRESSINGS) IMPLANT
ELECT REM PT RETURN 9FT ADLT (ELECTROSURGICAL) ×3
ELECTRODE REM PT RTRN 9FT ADLT (ELECTROSURGICAL) ×1 IMPLANT
GAUZE SPONGE 4X4 12PLY STRL (GAUZE/BANDAGES/DRESSINGS) ×3 IMPLANT
GLOVE BIO SURGEON STRL SZ7.5 (GLOVE) ×3 IMPLANT
GOWN STRL REUS W/TWL XL LVL3 (GOWN DISPOSABLE) ×12 IMPLANT
KIT BASIN OR (CUSTOM PROCEDURE TRAY) ×3 IMPLANT
MESH 3DMAX 4X6 RT LRG (Mesh General) ×3 IMPLANT
NEEDLE INSUFFLATION 14GA 120MM (NEEDLE) IMPLANT
NS IRRIG 1000ML POUR BTL (IV SOLUTION) ×3 IMPLANT
PLUG CATH AND CAP STER (CATHETERS) ×3 IMPLANT
RELOAD STAPLE HERNIA 4.0 BLUE (INSTRUMENTS) ×6 IMPLANT
RELOAD STAPLE HERNIA 4.8 BLK (STAPLE) ×3 IMPLANT
SCISSORS LAP 5X35 DISP (ENDOMECHANICALS) IMPLANT
SET IRRIG TUBING LAPAROSCOPIC (IRRIGATION / IRRIGATOR) IMPLANT
SOLUTION ANTI FOG 6CC (MISCELLANEOUS) ×3 IMPLANT
STAPLER HERNIA 12 8.5 360D (INSTRUMENTS) ×6 IMPLANT
STRIP CLOSURE SKIN 1/2X4 (GAUZE/BANDAGES/DRESSINGS) ×2 IMPLANT
SUT MNCRL AB 4-0 PS2 18 (SUTURE) IMPLANT
TAPE CLOTH SURG 4X10 WHT LF (GAUZE/BANDAGES/DRESSINGS) ×3 IMPLANT
TOWEL OR 17X26 10 PK STRL BLUE (TOWEL DISPOSABLE) ×3 IMPLANT
TOWEL OR NON WOVEN STRL DISP B (DISPOSABLE) ×3 IMPLANT
TRAY FOLEY CATH 14FRSI W/METER (CATHETERS) IMPLANT
TRAY FOLEY CATH 16FRSI W/METER (SET/KITS/TRAYS/PACK) ×3 IMPLANT
TRAY LAPAROSCOPIC (CUSTOM PROCEDURE TRAY) ×3 IMPLANT
TROCAR CANNULA W/PORT DUAL 5MM (MISCELLANEOUS) ×9 IMPLANT
TROCAR XCEL 12X100 BLDLESS (ENDOMECHANICALS) IMPLANT
TUBING INSUFFLATION 10FT LAP (TUBING) ×3 IMPLANT

## 2014-04-01 NOTE — Op Note (Signed)
04/01/2014  1:49 PM  PATIENT:  Curtis Allison  63 y.o. male  PRE-OPERATIVE DIAGNOSIS:  right inguinal hernia  POST-OPERATIVE DIAGNOSIS:  right inguinal hernia  PROCEDURE:  Procedure(s): LAPAROSCOPIC RIGHT  INGUINAL HERNIA REPAIR WITH MESH (Right)  SURGEON:  Surgeon(s) and Role:    * Ralene Ok, MD - Primary  ASSISTANTS: none   ANESTHESIA:   local and general  EBL:     BLOOD ADMINISTERED:none  DRAINS: none   LOCAL MEDICATIONS USED:  BUPIVICAINE   SPECIMEN:  No Specimen  DISPOSITION OF SPECIMEN:  N/A  COUNTS:  YES  TOURNIQUET:  * No tourniquets in log *  DICTATION: Viviann Spare Dictation  Indications for procedure:  The patient is a 63 year old male with a Right inguinal hernia for several months. Patient complained of symptomatology to his right inguinal area. The patient was taken back for elective inguinal hernia repair.  Details of the procedure: The patient was taken back to the operating room. The patient was placed in supine position with bilateral SCDs in place.  The patient was prepped and draped in the usual sterile fashion.  After appropriate anitbiotics were confirmed, a time-out was confirmed and all facts were verified.  0.25% Marcaine was used to infiltrate the umbilical area. He was used to cut down the skin and blunt dissection was used to get the anterior fashion.  The anterior fascia was incised approximately 1 cm and the muscles were divided anteriorly. Blunt dissection was then used to create a space in the preperitoneal area. At this time a 10 mm camera was then introduced into the space and advanced the pubic tubercle and a 12 mm trocar was placed over this and insufflation was started.  At this time and space was created from medial to laterally the preperitoneal space. The hernia sac was identified in the indirect space.  It was a large indirect hernia. Dissection of the hernia sac was undertaken the vas deferens was identified and protected in all  parts of the case.   Once the hernia sac was taken down to approximately the umbilicus a Bard 3D Max mesh introduced into the preperitoneal space.  The mesh was brought over the indirect and direct hernia spaces and overlapped the defect well.  It was anchored into place and secured to Cooper's ligament with 4.21mm staples from a Coviden hernia stapler. It was anchored to the anterior abdominal wall with 4.8 mm staples. The hernia sac was seen lying anterior to the mesh. There was no staples placed laterally. The insufflation was evacuated. The trochars were removed. The anterior fascia was reapproximated using #1 Vicryl on a UR- 6.  Intra-abdominal air was evacuated and the Veress needle removed. The skin was reapproximated using 4-0 Monocryl subcuticular fashion the patient was awakened from general anesthesia and taken to recovery in stable condition.   PLAN OF CARE: Discharge to home after PACU  PATIENT DISPOSITION:  PACU - hemodynamically stable.   Delay start of Pharmacological VTE agent (>24hrs) due to surgical blood loss or risk of bleeding: not applicable

## 2014-04-01 NOTE — Discharge Instructions (Signed)
[facility] _______[facility]  UMBILICAL OR INGUINAL HERNIA REPAIR: POST OP INSTRUCTIONS  Always review your discharge instruction sheet given to you by the facility where your surgery was performed. IF YOU HAVE DISABILITY OR FAMILY LEAVE FORMS, YOU MUST BRING THEM TO THE OFFICE FOR PROCESSING.   DO NOT GIVE THEM TO YOUR DOCTOR.  1. A  prescription for pain medication may be given to you upon discharge.  Take your pain medication as prescribed, if needed.  If narcotic pain medicine is not needed, then you may take acetaminophen (Tylenol) or ibuprofen (Advil) as needed. 2. Take your usually prescribed medications unless otherwise directed. 3. If you need a refill on your pain medication, please contact your pharmacy.  They will contact our office to request authorization. Prescriptions will not be filled after 5 pm or on week-ends. 4. You should follow a light diet the first 24 hours after arrival home, such as soup and crackers, etc.  Be sure to include lots of fluids daily.  Resume your normal diet the day after surgery. 5. Most patients will experience some swelling and bruising around the umbilicus or in the groin and scrotum.  Ice packs and reclining will help.  Swelling and bruising can take several days to resolve.  6. It is common to experience some constipation if taking pain medication after surgery.  Increasing fluid intake and taking a stool softener (such as Colace) will usually help or prevent this problem from occurring.  A mild laxative (Milk of Magnesia or Miralax) should be taken according to package directions if there are no bowel movements after 48 hours. 7. Unless discharge instructions indicate otherwise, you may remove your bandages 24-48 hours after surgery, and you may shower at that time.  You may have steri-strips (small skin tapes) in place directly over the incision.  These strips should be left on the skin for 7-10 days.  If your surgeon used skin glue on the  incision, you may shower in 24 hours.  The glue will flake off over the next 2-3 weeks.  Any sutures or staples will be removed at the office during your follow-up visit. 8. ACTIVITIES:  You may resume regular (light) daily activities beginning the next day--such as daily self-care, walking, climbing stairs--gradually increasing activities as tolerated.  You may have sexual intercourse when it is comfortable.  Refrain from any heavy lifting or straining until approved by your doctor. a. You may drive when you are no longer taking prescription pain medication, you can comfortably wear a seatbelt, and you can safely maneuver your car and apply brakes. b. RETURN TO WORK:  __________________________________________________________ 9. You should see your doctor in the office for a follow-up appointment approximately 2-3 weeks after your surgery.  Make sure that you call for this appointment within a day or two after you arrive home to insure a convenient appointment time. 10. OTHER INSTRUCTIONS:  __________________________________________________________________________________________________________________________________________________________________________________________  WHEN TO CALL YOUR DOCTOR: 1. Fever over 101.0 2. Inability to urinate 3. Nausea and/or vomiting 4. Extreme swelling or bruising 5. Continued bleeding from incision. 6. Increased pain, redness, or drainage from the incision  The clinic staff is available to answer your questions during regular business hours.  Please don't hesitate to call and ask to speak to one of the nurses for clinical concerns.  If you have a medical emergency, go to the nearest emergency room or call 911.  A surgeon from [facility] is always on call at the hospital     [address], [address], [place], [place]  [address] ?  [address] [address], [place], [place]   [address] [phone] ? [phone] ? FAX [phone] Web site:  [email or web address]  

## 2014-04-01 NOTE — Anesthesia Postprocedure Evaluation (Signed)
  Anesthesia Post-op Note  Patient: Curtis Allison  Procedure(s) Performed: Procedure(s) (LRB): LAPAROSCOPIC RIGHT  INGUINAL HERNIA REPAIR WITH MESH (Right)  Patient Location: PACU  Anesthesia Type: General  Level of Consciousness: awake and alert   Airway and Oxygen Therapy: Patient Spontanous Breathing  Post-op Pain: mild  Post-op Assessment: Post-op Vital signs reviewed, Patient's Cardiovascular Status Stable, Respiratory Function Stable, Patent Airway and No signs of Nausea or vomiting  Last Vitals:  Filed Vitals:   04/01/14 1406  BP: 138/72  Pulse: 83  Temp: 36.7 C  Resp: 11    Post-op Vital Signs: stable   Complications: No apparent anesthesia complications

## 2014-04-01 NOTE — H&P (Signed)
The patient is a 63 year old male who presents with an inguinal hernia. The patient is a 63 year old man who was previously seen for a right inguinal hernia. Patient was initially referred by Dr. Osborne Casco. This states he said this year for approximately 3 years. He states continued to become discomforting. He was initially scheduled for surgery proximally one year ago. 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 Patient denies any signs or symptoms of incarceration intervention.   Other Problems Ivor Costa, [place]; 03/14/2014 8:51 AM) High blood pressure Hypercholesterolemia  Past Surgical History Ivor Costa, [place]; 03/14/2014 8:51 AM) Colon Polyp Removal - Colonoscopy  Diagnostic Studies History Ivor Costa, [place]; 03/14/2014 8:51 AM) Colonoscopy 1-5 years ago  Social History Ivor Costa, [place]; 03/14/2014 8:51 AM) Alcohol use Occasional alcohol use. Caffeine use Coffee, Tea. Illicit drug use Remotely quit drug use. Tobacco use Former smoker.  Family History Ivor Costa, [place]; 03/14/2014 8:51 AM) Breast Cancer Mother. Diabetes Mellitus Mother, Sister. Heart Disease Father. Thyroid problems Mother.  Review of Systems Alyse Low [place] MA; 03/14/2014 8:52 AM) General Not Present- Appetite Loss, Chills, Fatigue, Fever, Night Sweats, Weight Gain and Weight Loss. Skin Not Present- Change in Wart/Mole, Dryness, Hives, Jaundice, New Lesions, Non-Healing Wounds, Rash and Ulcer. HEENT Present- Seasonal Allergies. Not Present- Earache, Hearing Loss, Hoarseness, Nose Bleed, Oral Ulcers, Ringing in the Ears, Sinus Pain, Sore Throat, Visual Disturbances, Wears glasses/contact lenses and Yellow Eyes. Respiratory Present- Snoring. Not Present- Bloody sputum, Chronic Cough, Difficulty Breathing and Wheezing. Cardiovascular Not Present- Chest Pain, Difficulty Breathing Lying Down, Leg Cramps, Palpitations, Rapid Heart Rate, Shortness of Breath and  Swelling of Extremities. Gastrointestinal Not Present- Abdominal Pain, Bloating, Bloody Stool, Change in Bowel Habits, Chronic diarrhea, Constipation, Difficulty Swallowing, Excessive gas, Gets full quickly at meals, Hemorrhoids, Indigestion, Nausea, Rectal Pain and Vomiting. Male Genitourinary Not Present- Blood in Urine, Change in Urinary Stream, Frequency, Impotence, Nocturia, Painful Urination, Urgency and Urine Leakage. Musculoskeletal Not Present- Back Pain, Joint Pain, Joint Stiffness, Muscle Pain, Muscle Weakness and Swelling of Extremities. Neurological Not Present- Decreased Memory, Fainting, Headaches, Numbness, Seizures, Tingling, Tremor, Trouble walking and Weakness. Psychiatric Not Present- Anxiety, Bipolar, Change in Sleep Pattern, Depression, Fearful and Frequent crying. Endocrine Not Present- Cold Intolerance, Excessive Hunger, Hair Changes, Heat Intolerance, Hot flashes and New Diabetes. Hematology Not Present- Easy Bruising, Excessive bleeding, Gland problems, HIV and Persistent Infections.   Vitals Ivor Costa MA; 03/14/2014 8:53 AM) 03/14/2014 8:52 AM Weight: 223 lb Height: 71in Body Surface Area: 2.25 m Body Mass Index: 31.1 kg/m Temp.: 97.36F(Temporal)  Pulse: 78 (Regular)  BP: 154/90 (Sitting, Left Arm, Standard)    Physical Exam Ralene Ok MD; 03/14/2014 9:02 AM) General Mental Status-Alert. General Appearance-Consistent with stated age. Hydration-Well hydrated. Voice-Normal.  Head and Neck Head-normocephalic, atraumatic with no lesions or palpable masses. Trachea-midline.  Eye Eyeball - Bilateral-Extraocular movements intact. Sclera/Conjunctiva - Bilateral-No scleral icterus.  Chest and Lung Exam Chest and lung exam reveals -quiet, even and easy respiratory effort with no use of accessory muscles and on auscultation, normal breath sounds, no adventitious sounds and normal vocal resonance. Inspection Chest Wall -  Normal. Back - normal.  Cardiovascular Cardiovascular examination reveals -normal heart sounds, regular rate and rhythm with no murmurs and normal pedal pulses bilaterally.  Abdomen Inspection Skin - Scar - no surgical scars. Hernias - Inguinal hernia - Right - Reducible. Palpation/Percussion Palpation and Percussion of the abdomen reveal - Soft, Non Tender, No Rebound tenderness, No Rigidity (guarding)  and No hepatosplenomegaly. Auscultation Auscultation of the abdomen reveals - Bowel sounds normal.  Neurologic Neurologic evaluation reveals -alert and oriented x 3 with no impairment of recent or remote memory. Mental Status-Normal.  Musculoskeletal Normal Exam - Left-Upper Extremity Strength Normal and Lower Extremity Strength Normal. Normal Exam - Right-Upper Extremity Strength Normal, Lower Extremity Weakness.    Assessment & Plan Ralene Ok MD; 03/14/2014 9:03 AM) INGUINAL HERNIA WITHOUT OBSTRUCTION OR GANGRENE, RECURRENCE NOT SPECIFIED, UNSPECIFIED LATERALITY (550.90  K40.90) Impression: The patient is a 63 year old male with a right inguinal hernia.  We'll proceed to the operating room for a laparoscopic right inguinal hernia repair with mesh. Current Plans  All risks and benefits were discussed with the patient to generally include, but not limited to: infection, bleeding, damage to surrounding structures, acute and chronic nerve pain, and recurrence. Alternatives were offered and described. All questions were answered and the patient voiced understanding of the procedure and wishes to proceed at this point with hernia repair. Schedule for Surgery

## 2014-04-01 NOTE — Transfer of Care (Signed)
Immediate Anesthesia Transfer of Care Note  Patient: Curtis Allison  Procedure(s) Performed: Procedure(s): LAPAROSCOPIC RIGHT  INGUINAL HERNIA REPAIR WITH MESH (Right)  Patient Location: PACU  Anesthesia Type:General  Level of Consciousness: awake, alert  and oriented  Airway & Oxygen Therapy: Patient Spontanous Breathing and Patient connected to face mask oxygen  Post-op Assessment: Report given to PACU RN and Post -op Vital signs reviewed and stable  Post vital signs: Reviewed and stable  Complications: No apparent anesthesia complications

## 2014-04-01 NOTE — Anesthesia Preprocedure Evaluation (Addendum)
Anesthesia Evaluation  Patient identified by MRN, date of birth, ID band Patient awake    Reviewed: Allergy & Precautions, H&P , NPO status , Patient's Chart, lab work & pertinent test results, reviewed documented beta blocker date and time   History of Anesthesia Complications Negative for: history of anesthetic complications  Airway Mallampati: III TM Distance: >3 FB Neck ROM: Full    Dental  (+) Edentulous Upper, Edentulous Lower   Pulmonary former smoker,  breath sounds clear to auscultation  Pulmonary exam normal       Cardiovascular Exercise Tolerance: Good hypertension, Pt. on medications and Pt. on home beta blockers Rhythm:Regular Rate:Normal     Neuro/Psych negative neurological ROS  negative psych ROS   GI/Hepatic negative GI ROS, Neg liver ROS,   Endo/Other  diabetes, Type 2, Oral Hypoglycemic Agents  Renal/GU negative Renal ROS  negative genitourinary   Musculoskeletal negative musculoskeletal ROS (+)   Abdominal   Peds negative pediatric ROS (+)  Hematology negative hematology ROS (+)   Anesthesia Other Findings   Reproductive/Obstetrics negative OB ROS                         Anesthesia Physical Anesthesia Plan  ASA: II  Anesthesia Plan: General   Post-op Pain Management:    Induction: Intravenous  Airway Management Planned: Oral ETT  Additional Equipment:   Intra-op Plan:   Post-operative Plan: Extubation in OR  Informed Consent: I have reviewed the patients History and Physical, chart, labs and discussed the procedure including the risks, benefits and alternatives for the proposed anesthesia with the patient or authorized representative who has indicated his/her understanding and acceptance.   Dental advisory given  Plan Discussed with: CRNA  Anesthesia Plan Comments:         Anesthesia Quick Evaluation

## 2014-04-02 ENCOUNTER — Encounter (HOSPITAL_COMMUNITY): Payer: Self-pay | Admitting: General Surgery

## 2014-04-04 ENCOUNTER — Telehealth (INDEPENDENT_AMBULATORY_CARE_PROVIDER_SITE_OTHER): Payer: Self-pay

## 2014-04-04 NOTE — Telephone Encounter (Signed)
Pt is po inguinal hernia repair on 03/26/14.  He is calling today concerned about swelling in the groin and scrotal area.  I explained that this is very common after hernia surgery.  Advised him to apply ice to the groin area and elevate feet whenever possible.  Pt voiced understanding and agreed with POC.

## 2014-12-23 ENCOUNTER — Other Ambulatory Visit: Payer: Self-pay | Admitting: Physician Assistant

## 2014-12-24 ENCOUNTER — Other Ambulatory Visit: Payer: Self-pay | Admitting: Physician Assistant

## 2014-12-25 ENCOUNTER — Other Ambulatory Visit: Payer: Self-pay | Admitting: Physician Assistant

## 2015-05-29 ENCOUNTER — Encounter (HOSPITAL_COMMUNITY): Payer: Self-pay | Admitting: *Deleted

## 2015-05-29 ENCOUNTER — Emergency Department (INDEPENDENT_AMBULATORY_CARE_PROVIDER_SITE_OTHER)
Admission: EM | Admit: 2015-05-29 | Discharge: 2015-05-29 | Disposition: A | Discharge status: Home / Self Care | Payer: Self-pay | Source: Home / Self Care | Attending: Emergency Medicine | Admitting: Emergency Medicine

## 2015-05-29 DIAGNOSIS — B86 Scabies: Secondary | ICD-10-CM

## 2015-05-29 DIAGNOSIS — L03115 Cellulitis of right lower limb: Secondary | ICD-10-CM

## 2015-05-29 MED ORDER — SULFAMETHOXAZOLE-TRIMETHOPRIM 800-160 MG PO TABS: 2.0000 | ORAL_TABLET | Freq: Two times a day (BID) | ORAL | Status: DC

## 2015-05-29 MED ORDER — PREDNISONE 50 MG PO TABS: 50.0000 mg | ORAL_TABLET | Freq: Every day | ORAL | Status: DC

## 2015-05-29 MED ORDER — PERMETHRIN 0.25 % LIQD
Status: AC
  Filled 2015-05-29: qty 147.86

## 2015-05-29 MED ORDER — PERMETHRIN 5 % EX CREA: TOPICAL_CREAM | CUTANEOUS | Status: DC

## 2015-05-29 NOTE — ED Notes (Signed)
Itchy raised rash started on right lower leg a week ago.  It has spread since then to most of the body except the face

## 2015-05-29 NOTE — Discharge Instructions (Signed)
Scabies is usually transmitted through close physical contact, but may be transmitted through clothing, linens, or towels. Apply the permethrin from head to soles of feet at bedtime, leave on for 8-14 hours. Mites tend to persist under the fingernails. Trim them, scrub beneath them, and apply the permetrhin under your nails. Repeat 1 week later. You will also need to treat family members, frequent household guests, close physical or sexual contacts, even if they have no symptoms. Wash all clothing, bedding, and towels in hot water, dry cleaned, or placed through the heat cycle of a dryer to prevent reinfection. Or you can place all bedding and clothing that might be infexted in sealed plastic bags for 72 hours.  Make sure you thoroughly clean the infected person's room. The itching will not go away immediately. Continued itching does not mean that the treatment was ineffective. Dead mites and eggs will cause an immune response but will eventually go away as the skin turns over. Go to www.goodrx.com to look up your medications. This will give you a list of where you can find your prescriptions at the most affordable prices.    Follow-up with your  primary care physician or come here if the lesion on your leg is not getting significantly better in 72 hours. Go to the ER for fevers, if you get worse, or other concerns.

## 2015-05-29 NOTE — ED Provider Notes (Signed)
HPI  SUBJECTIVE:  Curtis Allison is a 64 y.o. male who presents with a diffuse, pruritic rash starting on his right posterior calf one week ago. Patient states that the rash is "now everywhere" except his face. He states that the itching is worse at night. He tried Cortizone 10, Epsom salts and over-the-counter steroid pattern. There are no other aggravating or alleviating factors. No contacts with similar symptoms. No nausea, vomiting, fevers, new lotions, soaps, detergents, medications. The blisters, crusting, upper respiratory like symptoms. No sensation being bitten at night, no blood in the bedclothes in the morning. There are pets in the house. No known exposure to poison ivy. Past medical history of diabetes controlled with oral medications, hypertension, hypercholesterolemia. No history of eczema, psoriasis, atopy    Past Medical History  Diagnosis Date  . Hypertension     diagnosed 2006  . Diabetes mellitus ([place])     type II ; diagnosed 2006  . Gout   . Inguinal hernia     Past Surgical History  Procedure Laterality Date  . No past surgeries    . Inguinal hernia repair Right 04/01/2014    Procedure: LAPAROSCOPIC RIGHT  INGUINAL HERNIA REPAIR WITH MESH;  Surgeon: Ralene Ok, MD;  Location: [facility];  Service: General;  Laterality: Right;    Family History  Problem Relation Age of Onset  . Colon cancer Maternal Grandfather   . Diabetes Mother   . Hypertension Mother   . Diabetes Sister   . Hypertension Sister   . Heart disease Father     Social History  Substance Use Topics  . Smoking status: Former Smoker    Quit date: 01/04/2009  . Smokeless tobacco: Never Used  . Alcohol Use: 2.0 oz/week    4 drink(s) per week     Comment: OCCASIONAL    No current facility-administered medications for this encounter.  Current outpatient prescriptions:  .  Alogliptin-Pioglitazone (OSENI) 25-15 MG TABS, Take 1 tablet by mouth every evening. , Disp: , Rfl:  .  amLODipine  (NORVASC) 10 MG tablet, Take 10 mg by mouth every evening. , Disp: , Rfl:  .  atenolol (TENORMIN) 50 MG tablet, Take 50 mg by mouth every morning. , Disp: , Rfl:  .  colchicine 0.6 MG tablet, Take 0.6 mg by mouth. Take two tablets, Then one tablet every hour for flare ups., Disp: , Rfl:  .  hydrocortisone cream 1 %, Apply 1 application topically once as needed for itching., Disp: , Rfl:  .  lisinopril (PRINIVIL,ZESTRIL) 20 MG tablet, Take 20 mg by mouth every morning. , Disp: , Rfl:  .  Tetrahydrozoline HCl (VISINE EXTRA OP), Place 2 drops into both eyes once as needed (dry eyes.)., Disp: , Rfl:  .  TRILIPIX 135 MG capsule, Take 135 mg by mouth every evening. , Disp: , Rfl:  .  oxyCODONE-acetaminophen (ROXICET) 5-325 MG per tablet, Take 1-2 tablets by mouth every 4 (four) hours as needed for severe pain., Disp: 30 tablet, Rfl: 0 .  permethrin (ELIMITE) 5 % cream, Apply from chin down, leave on for 8-14 hours, rinse. Repeat in 1 week, Disp: 60 g, Rfl: 0 .  predniSONE (DELTASONE) 50 MG tablet, Take 1 tablet (50 mg total) by mouth daily with breakfast., Disp: 5 tablet, Rfl: 0 .  sulfamethoxazole-trimethoprim (BACTRIM DS,SEPTRA DS) 800-160 MG tablet, Take 2 tablets by mouth 2 (two) times daily., Disp: 40 tablet, Rfl: 0  Allergies  Allergen Reactions  . Penicillins Itching  ROS  As noted in HPI.   Physical Exam  BP 171/96 mmHg  Pulse 100  Temp(Src) 98.8 F (37.1 C) (Oral)  Resp 18  SpO2 97%  Constitutional: Well developed, well nourished, no acute distress Eyes:  EOMI, conjunctiva normal bilaterally HENT: Normocephalic, atraumatic,mucus membranes moist Respiratory: Normal inspiratory effort Cardiovascular: Normal rate GI: nondistended skin: 8.5 by 11 erythematous, nontender, non-blanchable scaly lesion posterior right calf. Marked this with a marker for reference. Slightly increased temperature. No crusting. + excoriations. + diffuse scattered papular erythematous rash over torso,  abdomen, arms and legs including in between fingers.  Musculoskeletal: no deformities Neurologic: Alert & oriented x 3, no focal neuro deficits Psychiatric: Speech and behavior appropriate   ED Course   Medications - No data to display  No orders of the defined types were placed in this encounter.    No results found for this or any previous visit (from the past 24 hour(s)). No results found.  ED Clinical Impression  Scabies  Cellulitis of right lower extremity       ED Assessment/Plan  Appears to be a cellulitis on his right posterior calf with increased temperature, is not blanchable. It does have a scaly dry areas, so could be a contact dermatitis. Does not appear to be eczema or psoriasis. Wrist with rash over his body appears to be scabies. Will treat the lesion on his leg as a cellulitis, home with Bactrim. We will treat the other rash as scabies with permethrin, Claritin or Zyrtec, oral steroids. Advised patient that this will elevate sugars while taking the medication. Follow-up with primary care physician go to the ER if gets worse or for fevers. Patient agrees with plan.   *This clinic note was created using Dragon dictation software. Therefore, there may be occasional mistakes despite careful proofreading.  ?   Melynda Ripple, MD 05/29/15 [phone]

## 2016-01-29 LAB — HEPATIC FUNCTION PANEL
ALK PHOS: 82 (ref 25–125)
ALT: 20 (ref 10–40)
AST: 21 (ref 14–40)
Bilirubin, Total: 0.5

## 2016-01-29 LAB — BASIC METABOLIC PANEL
BUN: 9 (ref 4–21)
CREATININE: 0.9 (ref 0.6–1.3)
Glucose: 312
POTASSIUM: 3.5 (ref 3.4–5.3)
Sodium: 136 — AB (ref 137–147)

## 2016-01-29 LAB — PSA: PSA: 2.34

## 2016-01-29 LAB — MICROALBUMIN, URINE: MICROALB UR: 3.18

## 2016-01-29 LAB — HEMOGLOBIN A1C: Hemoglobin A1C: 10

## 2016-04-28 LAB — LIPID PANEL
CHOLESTEROL: 260 — AB (ref 0–200)
HDL: 53 (ref 35–70)
LDL Cholesterol: 130
TRIGLYCERIDES: 387 — AB (ref 40–160)

## 2016-04-28 LAB — BASIC METABOLIC PANEL
BUN: 15 (ref 4–21)
CREATININE: 1.2 (ref 0.6–1.3)
Glucose: 167
POTASSIUM: 3.9 (ref 3.4–5.3)
Sodium: 138 (ref 137–147)

## 2016-04-28 LAB — HEMOGLOBIN A1C: Hemoglobin A1C: 8.1

## 2016-09-06 LAB — BASIC METABOLIC PANEL
BUN: 11 (ref 4–21)
Creatinine: 1.1 (ref 0.6–1.3)
GLUCOSE: 218
Potassium: 3.6 (ref 3.4–5.3)
Sodium: 136 — AB (ref 137–147)

## 2016-09-06 LAB — HEMOGLOBIN A1C: Hemoglobin A1C: 9.3

## 2017-03-02 ENCOUNTER — Encounter: Payer: Self-pay | Admitting: Internal Medicine

## 2017-05-04 ENCOUNTER — Ambulatory Visit (INDEPENDENT_AMBULATORY_CARE_PROVIDER_SITE_OTHER): Payer: Medicare Other | Admitting: Nurse Practitioner

## 2017-05-04 ENCOUNTER — Encounter: Payer: Self-pay | Admitting: Nurse Practitioner

## 2017-05-04 DIAGNOSIS — I10 Essential (primary) hypertension: Secondary | ICD-10-CM | POA: Diagnosis not present

## 2017-05-04 DIAGNOSIS — M1990 Unspecified osteoarthritis, unspecified site: Secondary | ICD-10-CM | POA: Insufficient documentation

## 2017-05-04 DIAGNOSIS — E785 Hyperlipidemia, unspecified: Secondary | ICD-10-CM | POA: Insufficient documentation

## 2017-05-04 DIAGNOSIS — E119 Type 2 diabetes mellitus without complications: Secondary | ICD-10-CM | POA: Insufficient documentation

## 2017-05-04 DIAGNOSIS — M1A9XX0 Chronic gout, unspecified, without tophus (tophi): Secondary | ICD-10-CM

## 2017-05-04 DIAGNOSIS — E118 Type 2 diabetes mellitus with unspecified complications: Secondary | ICD-10-CM | POA: Diagnosis not present

## 2017-05-04 DIAGNOSIS — M109 Gout, unspecified: Secondary | ICD-10-CM | POA: Insufficient documentation

## 2017-05-04 DIAGNOSIS — E782 Mixed hyperlipidemia: Secondary | ICD-10-CM | POA: Diagnosis not present

## 2017-05-04 LAB — URIC ACID: URIC ACID, SERUM: 8.4 mg/dL — AB (ref 4.0–8.0)

## 2017-05-04 MED ORDER — AMLODIPINE BESYLATE 10 MG PO TABS: 10.0000 mg | ORAL_TABLET | Freq: Every evening | ORAL | 2 refills | Status: DC

## 2017-05-04 MED ORDER — METFORMIN HCL ER (MOD) 1000 MG PO TB24: 1000.0000 mg | ORAL_TABLET | Freq: Every day | ORAL | 1 refills | Status: DC

## 2017-05-04 MED ORDER — CHOLINE FENOFIBRATE 135 MG PO CPDR: 135.0000 mg | DELAYED_RELEASE_CAPSULE | Freq: Every evening | ORAL | 2 refills | Status: DC

## 2017-05-04 MED ORDER — LISINOPRIL 20 MG PO TABS: 20.0000 mg | ORAL_TABLET | ORAL | 2 refills | Status: DC

## 2017-05-04 MED ORDER — PIOGLITAZONE HCL 30 MG PO TABS: 30.0000 mg | ORAL_TABLET | Freq: Every day | ORAL | 2 refills | Status: DC

## 2017-05-04 MED ORDER — CLONIDINE HCL 0.1 MG PO TABS: 0.1000 mg | ORAL_TABLET | Freq: Once | ORAL | Status: DC

## 2017-05-04 MED ORDER — ATENOLOL 50 MG PO TABS: 50.0000 mg | ORAL_TABLET | ORAL | 2 refills | Status: DC

## 2017-05-04 NOTE — Patient Instructions (Addendum)
Get medication at pharmacy after you leave here Take blood pressure medication   Go home and take blood pressure, notify us if blood pressure is staying over 150/90  Follow up in 1 week for blood pressure check Follow up in 6 weeks for AWV and physical    DASH Eating Plan DASH stands for "Dietary Approaches to Stop Hypertension." The DASH eating plan is a healthy eating plan that has been shown to reduce high blood pressure (hypertension). It may also reduce your risk for type 2 diabetes, heart disease, and stroke. The DASH eating plan may also help with weight loss. What are tips for following this plan? General guidelines  Avoid eating more than 2,300 mg (milligrams) of salt (sodium) a day. If you have hypertension, you may need to reduce your sodium intake to 1,500 mg a day.  Limit alcohol intake to no more than 1 drink a day for nonpregnant women and 2 drinks a day for men. One drink equals 12 oz of beer, 5 oz of wine, or 1 oz of hard liquor.  Work with your health care provider to maintain a healthy body weight or to lose weight. Ask what an ideal weight is for you.  Get at least 30 minutes of exercise that causes your heart to beat faster (aerobic exercise) most days of the week. Activities may include walking, swimming, or biking.  Work with your health care provider or diet and nutrition specialist (dietitian) to adjust your eating plan to your individual calorie needs. Reading food labels  Check food labels for the amount of sodium per serving. Choose foods with less than 5 percent of the Daily Value of sodium. Generally, foods with less than 300 mg of sodium per serving fit into this eating plan.  To find whole grains, look for the word "whole" as the first word in the ingredient list. Shopping  Buy products labeled as "low-sodium" or "no salt added."  Buy fresh foods. Avoid canned foods and premade or frozen meals. Cooking  Avoid adding salt when cooking. Use salt-free  seasonings or herbs instead of table salt or sea salt. Check with your health care provider or pharmacist before using salt substitutes.  Do not fry foods. Cook foods using healthy methods such as baking, boiling, grilling, and broiling instead.  Cook with heart-healthy oils, such as olive, canola, soybean, or sunflower oil. Meal planning   Eat a balanced diet that includes: ? 5 or more servings of fruits and vegetables each day. At each meal, try to fill half of your plate with fruits and vegetables. ? Up to 6-8 servings of whole grains each day. ? Less than 6 oz of lean meat, poultry, or fish each day. A 3-oz serving of meat is about the same size as a deck of cards. One egg equals 1 oz. ? 2 servings of low-fat dairy each day. ? A serving of nuts, seeds, or beans 5 times each week. ? Heart-healthy fats. Healthy fats called Omega-3 fatty acids are found in foods such as flaxseeds and coldwater fish, like sardines, salmon, and mackerel.  Limit how much you eat of the following: ? Canned or prepackaged foods. ? Food that is high in trans fat, such as fried foods. ? Food that is high in saturated fat, such as fatty meat. ? Sweets, desserts, sugary drinks, and other foods with added sugar. ? Full-fat dairy products.  Do not salt foods before eating.  Try to eat at least 2 vegetarian meals each week.  Eat more home-cooked food and less restaurant, buffet, and fast food.  When eating at a restaurant, ask that your food be prepared with less salt or no salt, if possible. What foods are recommended? The items listed may not be a complete list. Talk with your dietitian about what dietary choices are best for you. Grains Whole-grain or whole-wheat bread. Whole-grain or whole-wheat pasta. Brown rice. [place]. Bulgur. Whole-grain and low-sodium cereals. Pita bread. Low-fat, low-sodium crackers. Whole-wheat flour tortillas. Vegetables Fresh or frozen vegetables (raw, steamed, roasted,  or grilled). Low-sodium or reduced-sodium tomato and vegetable juice. Low-sodium or reduced-sodium tomato sauce and tomato paste. Low-sodium or reduced-sodium canned vegetables. Fruits All fresh, dried, or frozen fruit. Canned fruit in natural juice (without added sugar). Meat and other protein foods Skinless chicken or [place]. Ground chicken or [place]. Pork with fat trimmed off. Fish and seafood. Egg whites. Dried beans, peas, or lentils. Unsalted nuts, nut butters, and seeds. Unsalted canned beans. Lean cuts of beef with fat trimmed off. Low-sodium, lean deli meat. Dairy Low-fat (1%) or fat-free (skim) milk. Fat-free, low-fat, or reduced-fat cheeses. Nonfat, low-sodium ricotta or cottage cheese. Low-fat or nonfat yogurt. Low-fat, low-sodium cheese. Fats and oils Soft margarine without trans fats. Vegetable oil. Low-fat, reduced-fat, or light mayonnaise and salad dressings (reduced-sodium). Canola, safflower, olive, soybean, and sunflower oils. Avocado. Seasoning and other foods Herbs. Spices. Seasoning mixes without salt. Unsalted popcorn and pretzels. Fat-free sweets. What foods are not recommended? The items listed may not be a complete list. Talk with your dietitian about what dietary choices are best for you. Grains Baked goods made with fat, such as croissants, muffins, or some breads. Dry pasta or rice meal packs. Vegetables Creamed or fried vegetables. Vegetables in a cheese sauce. Regular canned vegetables (not low-sodium or reduced-sodium). Regular canned tomato sauce and paste (not low-sodium or reduced-sodium). Regular tomato and vegetable juice (not low-sodium or reduced-sodium). [name]. Olives. Fruits Canned fruit in a light or heavy syrup. Fried fruit. Fruit in cream or butter sauce. Meat and other protein foods Fatty cuts of meat. Ribs. Fried meat. [name]. Sausage. Bologna and other processed lunch meats. Salami. Fatback. Hotdogs. Bratwurst. Salted nuts and seeds. Canned beans  with added salt. Canned or smoked fish. Whole eggs or egg yolks. Chicken or [place] with skin. Dairy Whole or 2% milk, cream, and half-and-half. Whole or full-fat cream cheese. Whole-fat or sweetened yogurt. Full-fat cheese. Nondairy creamers. Whipped toppings. Processed cheese and cheese spreads. Fats and oils Butter. Stick margarine. Lard. Shortening. Ghee. Bacon fat. Tropical oils, such as coconut, palm kernel, or palm oil. Seasoning and other foods Salted popcorn and pretzels. Onion salt, garlic salt, seasoned salt, table salt, and sea salt. Worcestershire sauce. Tartar sauce. Barbecue sauce. Teriyaki sauce. Soy sauce, including reduced-sodium. Steak sauce. Canned and packaged gravies. Fish sauce. Oyster sauce. Cocktail sauce. Horseradish that you find on the shelf. Ketchup. Mustard. Meat flavorings and tenderizers. Bouillon cubes. Hot sauce and Tabasco sauce. Premade or packaged marinades. Premade or packaged taco seasonings. Relishes. Regular salad dressings. Where to find more information:  National Heart, Lung, and [place]: https://wilson-eaton.com/  American Heart Association: www.heart.org Summary  The DASH eating plan is a healthy eating plan that has been shown to reduce high blood pressure (hypertension). It may also reduce your risk for type 2 diabetes, heart disease, and stroke.  With the DASH eating plan, you should limit salt (sodium) intake to 2,300 mg a day. If you have hypertension, you may need to reduce your sodium intake  to 1,500 mg a day.  When on the DASH eating plan, aim to eat more fresh fruits and vegetables, whole grains, lean proteins, low-fat dairy, and heart-healthy fats.  Work with your health care provider or diet and nutrition specialist (dietitian) to adjust your eating plan to your individual calorie needs. This information is not intended to replace advice given to you by your health care provider. Make sure you discuss any questions you have with your health  care provider. Document Released: 05/20/2011 Document Revised: 05/24/2016 Document Reviewed: 05/24/2016 Elsevier Interactive Patient Education  2017 [name].

## 2017-05-04 NOTE — Progress Notes (Signed)
Careteam: Patient Care Team: Curtis Chandler, NP as PCP - General (Geriatric Medicine) Curtis Luria, MD as Attending Physician (Dermatology)  Advanced Directive information Does Patient Have a Medical Advance Directive?: No, Would patient like information on creating a medical advance directive?: Yes (MAU/Ambulatory/Procedural Areas - Information given)  Allergies  Allergen Reactions  . Penicillins Itching    Chief Complaint  Patient presents with  . Medical Management of Chronic Issues    Pt is being seen to establish care for management of chronic conditions. Previous pcp was [facility]. Pt is switching due to that office closing.   . Medication Refill    Several medicaiton refills pended; pt wants to discuss changing DM medications     HPI: Patient is a 66 y.o. male seen in the office today for medical management and to establish care. Previous office closed so he is looking for new PCP.  He has been out of all medication for a month.  Does not like his diabetic medication and wishes to be on somethhing different.  Last physical was in 2017.   Rash- increase itching, using hydrocortisone but also sees dermatologist and reports has been using bactrim for a flare at one night.   Hyperlipidemia- using trilipix- fasting today.   Gout- no recent flares, has been taking uloric 80 mg and colchicine for FLARES only.   Diabetes- reports he does not check blood sugars at home. Taking metformin 500 mg twice daily. Has been on pioglitazone but current out of this.  Stopped taking jardiance due to frequency in urination    HTN- out of norvasc and lisinopril, took atenolol this morning.  Occasionally will use aleve for ache or pain.    Declines vaccines. Does not take them and does not wish to have influenza, pneumonia or tdap  Review of Systems:  Review of Systems  Constitutional: Negative for chills, fever and weight loss.  HENT: Positive for congestion (sinus).  Negative for tinnitus.   Eyes:       Cataracts, going to ophthalmology to follow this.   Respiratory: Negative for cough, sputum production and shortness of breath.   Cardiovascular: Negative for chest pain, palpitations and leg swelling.  Gastrointestinal: Negative for abdominal pain, constipation, diarrhea and heartburn.  Genitourinary: Negative for dysuria, frequency and urgency.  Musculoskeletal: Negative for back pain, falls, joint pain and myalgias.  Skin: Positive for itching and rash.       Seeing dermatologist Dr Curtis Allison   Neurological: Negative for dizziness and headaches.  Psychiatric/Behavioral: Negative for depression and memory loss. The patient does not have insomnia.     Past Medical History:  Diagnosis Date  . Diabetes mellitus ([place])    type II ; diagnosed 2006  . Gout   . Hyperlipidemia   . Hypertension    diagnosed 2006  . Inguinal hernia    Past Surgical History:  Procedure Laterality Date  . INGUINAL HERNIA REPAIR Right 04/01/2014   Procedure: LAPAROSCOPIC RIGHT  INGUINAL HERNIA REPAIR WITH MESH;  Surgeon: Ralene Ok, MD;  Location: [facility];  Service: General;  Laterality: Right;   Social History:   reports that he quit smoking about 8 years ago. he has never used smokeless tobacco. He reports that he drinks about 1.8 oz of alcohol per week. He reports that he uses drugs. Drug: Marijuana.  Family History  Problem Relation Age of Onset  . Diabetes Mother   . Hypertension Mother   . Lung cancer Mother   . Diabetes  Sister   . Hypertension Sister   . Heart disease Father   . Diabetes Father   . Colon cancer Maternal Grandfather     Medications:   Medication List        Accurate as of 05/04/17  8:55 AM. Always use your most recent med list.          amLODipine 10 MG tablet Commonly known as:  NORVASC   atenolol 50 MG tablet Commonly known as:  TENORMIN   colchicine 0.6 MG tablet   hydrocortisone cream 1 %   JARDIANCE 25 MG Tabs  tablet Generic drug:  empagliflozin   lisinopril 20 MG tablet Commonly known as:  PRINIVIL,ZESTRIL   metFORMIN 500 MG tablet Commonly known as:  GLUCOPHAGE   naproxen sodium 220 MG tablet Commonly known as:  ALEVE   pioglitazone 30 MG tablet Commonly known as:  ACTOS   sulfamethoxazole-trimethoprim 800-160 MG tablet Commonly known as:  BACTRIM DS,SEPTRA DS Take 2 tablets by mouth 2 (two) times daily.   TRILIPIX 135 MG capsule Generic drug:  Choline Fenofibrate   ULORIC 80 MG Tabs Generic drug:  Febuxostat   VISINE EXTRA OP        Physical Exam:  Vitals:   05/04/17 0850  BP: (!) 220/100  Pulse: 81  Resp: 18  Temp: 98.4 F (36.9 C)  TempSrc: Oral  SpO2: 96%  Weight: 226 lb (102.5 kg)  Height: '5\' 10"'  (1.778 m)   Body mass index is 32.43 kg/m.  Physical Exam  Constitutional: He is oriented to person, place, and time. He appears well-developed and well-nourished. No distress.  overweight  HENT:  Head: Normocephalic and atraumatic.  Mouth/Throat: Oropharynx is clear and moist. No oropharyngeal exudate.  Eyes: Conjunctivae and EOM are normal. Pupils are equal, round, and reactive to light.  Neck: Normal range of motion. Neck supple.  Cardiovascular: Normal rate, regular rhythm and normal heart sounds.  Pulmonary/Chest: Effort normal and breath sounds normal.  Abdominal: Soft. Bowel sounds are normal.  Musculoskeletal: He exhibits no edema or tenderness.  Neurological: He is alert and oriented to person, place, and time.  Skin: Skin is warm and dry. He is not diaphoretic.  Psychiatric: He has a normal mood and affect.   Labs reviewed: Basic Metabolic Panel: No results for input(s): NA, K, CL, CO2, GLUCOSE, BUN, CREATININE, CALCIUM, MG, PHOS, TSH in the last 8760 hours. Liver Function Tests: No results for input(s): AST, ALT, ALKPHOS, BILITOT, PROT, ALBUMIN in the last 8760 hours. No results for input(s): LIPASE, AMYLASE in the last 8760 hours. No results  for input(s): AMMONIA in the last 8760 hours. CBC: No results for input(s): WBC, NEUTROABS, HGB, HCT, MCV, PLT in the last 8760 hours. Lipid Panel: No results for input(s): CHOL, HDL, LDLCALC, TRIG, CHOLHDL, LDLDIRECT in the last 8760 hours. TSH: No results for input(s): TSH in the last 8760 hours. A1C: No results found for: HGBA1C   Assessment/Plan 1. Essential hypertension -elevated today, reports white coat syndrome and despite being out of medication states blood pressure generally around 150/90 at  Home cloNIDine (CATAPRES) tablet 0.1 mg given in office with improvement in blood pressure to 200/100. -EKG done- in sinus rhythm rate 71 with occasional PAC; no acute changes noted, reviewed with Dr Eulas Post in office.  -dash diet encouraged.  - amLODipine (NORVASC) 10 MG tablet; Take 1 tablet (10 mg total) by mouth every evening.  Dispense: 60 tablet; Refill: 2 - atenolol (TENORMIN) 50 MG tablet; Take 1 tablet (50  mg total) by mouth every morning.  Dispense: 60 tablet; Refill: 2 - lisinopril (PRINIVIL,ZESTRIL) 20 MG tablet; Take 1 tablet (20 mg total) by mouth every morning.  Dispense: 60 tablet; Refill: 2 -pt instructed to take norvasc and lisinopril today, to check bp at home and notify if remaining elevated over 150/90 Pt currently without any symptoms of chest pains, shortness of breath, headache, dizziness, no fatigue or malaise- given strict precautions to go to ED if any symptoms occur since his bp has been elevated.  - CBC with Differential/Platelets  2. Mixed hyperlipidemia -encouraged dietary modifications.  - Choline Fenofibrate (TRILIPIX) 135 MG capsule; Take 1 capsule (135 mg total) by mouth every evening.  Dispense: 60 capsule; Refill: 2 - Lipid Panel - CMP with eGFR  3. Chronic gout without tophus, unspecified cause, unspecified site -no recent flares.  - Uric acid  4. Type 2 diabetes mellitus with complication, without long-term current use of insulin  (HCC) -encouraged dietary changes, will check A1c today. Can not tolerate jardiance due to increase urination  - pioglitazone (ACTOS) 30 MG tablet; Take 1 tablet (30 mg total) by mouth daily.  Dispense: 60 tablet; Refill: 2 - Hemoglobin A1c - CBC with Differential/Platelets - metFORMIN (GLUMETZA) 1000 MG (MOD) 24 hr tablet; Take 1 tablet (1,000 mg total) by mouth daily with breakfast.  Dispense: 30 tablet; Refill: 1  5. Arthritis Occasional arthritic pains, to avoid NSAIDS and use acetaminophen PRN  Next appt: 1 week for Blood pressure Jessica K. Harle Battiest  [facility] [phone] 8 am - 5 pm) [phone] (after hours)

## 2017-05-05 LAB — CBC WITH DIFFERENTIAL/PLATELET
BASOS PCT: 0.4 %
Basophils Absolute: 33 cells/uL (ref 0–200)
Eosinophils Absolute: 139 cells/uL (ref 15–500)
Eosinophils Relative: 1.7 %
HCT: 46.3 % (ref 38.5–50.0)
HEMOGLOBIN: 16.2 g/dL (ref 13.2–17.1)
Lymphs Abs: 2394 cells/uL (ref 850–3900)
MCH: 32 pg (ref 27.0–33.0)
MCHC: 35 g/dL (ref 32.0–36.0)
MCV: 91.5 fL (ref 80.0–100.0)
MONOS PCT: 6.6 %
MPV: 12 fL (ref 7.5–12.5)
NEUTROS ABS: 5092 {cells}/uL (ref 1500–7800)
Neutrophils Relative %: 62.1 %
PLATELETS: 218 10*3/uL (ref 140–400)
RBC: 5.06 10*6/uL (ref 4.20–5.80)
RDW: 12.5 % (ref 11.0–15.0)
TOTAL LYMPHOCYTE: 29.2 %
WBC: 8.2 10*3/uL (ref 3.8–10.8)
WBCMIX: 541 {cells}/uL (ref 200–950)

## 2017-05-05 LAB — HEMOGLOBIN A1C
HEMOGLOBIN A1C: 10 %{Hb} — AB (ref <5.7)
Mean Plasma Glucose: 240 (calc)
eAG (mmol/L): 13.3 (calc)

## 2017-05-05 LAB — COMPLETE METABOLIC PANEL WITH GFR
AG Ratio: 1.4 (calc) (ref 1.0–2.5)
ALKALINE PHOSPHATASE (APISO): 65 U/L (ref 40–115)
ALT: 21 U/L (ref 9–46)
AST: 21 U/L (ref 10–35)
Albumin: 4.3 g/dL (ref 3.6–5.1)
BUN: 12 mg/dL (ref 7–25)
CO2: 24 mmol/L (ref 20–32)
CREATININE: 1.09 mg/dL (ref 0.70–1.25)
Calcium: 9.9 mg/dL (ref 8.6–10.3)
Chloride: 99 mmol/L (ref 98–110)
GFR, Est African American: 82 mL/min/{1.73_m2} (ref >=60)
GFR, Est Non African American: 71 mL/min/{1.73_m2} (ref >=60)
GLOBULIN: 3 g/dL (ref 1.9–3.7)
GLUCOSE: 249 mg/dL — AB (ref 65–139)
Potassium: 3.7 mmol/L (ref 3.5–5.3)
SODIUM: 136 mmol/L (ref 135–146)
Total Bilirubin: 0.5 mg/dL (ref 0.2–1.2)
Total Protein: 7.3 g/dL (ref 6.1–8.1)

## 2017-05-05 LAB — LIPID PANEL
CHOL/HDL RATIO: 9.8 (calc) — AB (ref <5.0)
CHOLESTEROL: 325 mg/dL — AB (ref <200)
HDL: 33 mg/dL — ABNORMAL LOW (ref >40)
NON-HDL CHOLESTEROL (CALC): 292 mg/dL — AB (ref <130)
Triglycerides: 1686 mg/dL — ABNORMAL HIGH (ref <150)

## 2017-05-09 ENCOUNTER — Ambulatory Visit (INDEPENDENT_AMBULATORY_CARE_PROVIDER_SITE_OTHER): Payer: Medicare Other | Admitting: Nurse Practitioner

## 2017-05-09 ENCOUNTER — Encounter: Payer: Self-pay | Admitting: Nurse Practitioner

## 2017-05-09 VITALS — BP 180/124 | HR 91 | Temp 98.3°F | Resp 17 | Ht 70.0 in | Wt 228.8 lb

## 2017-05-09 DIAGNOSIS — E785 Hyperlipidemia, unspecified: Secondary | ICD-10-CM | POA: Diagnosis not present

## 2017-05-09 DIAGNOSIS — E1169 Type 2 diabetes mellitus with other specified complication: Secondary | ICD-10-CM | POA: Diagnosis not present

## 2017-05-09 DIAGNOSIS — Z125 Encounter for screening for malignant neoplasm of prostate: Secondary | ICD-10-CM | POA: Diagnosis not present

## 2017-05-09 DIAGNOSIS — I10 Essential (primary) hypertension: Secondary | ICD-10-CM

## 2017-05-09 DIAGNOSIS — E1165 Type 2 diabetes mellitus with hyperglycemia: Secondary | ICD-10-CM

## 2017-05-09 MED ORDER — DAPAGLIFLOZIN PROPANEDIOL 5 MG PO TABS: 5.0000 mg | ORAL_TABLET | Freq: Every day | ORAL | 3 refills | Status: DC

## 2017-05-09 MED ORDER — GLUCOSE BLOOD VI STRP: 1.0000 | ORAL_STRIP | Freq: Every day | 6 refills | Status: DC

## 2017-05-09 MED ORDER — OMEGA-3-ACID ETHYL ESTERS 1 G PO CAPS: 2.0000 g | ORAL_CAPSULE | Freq: Two times a day (BID) | ORAL | 2 refills | Status: DC

## 2017-05-09 MED ORDER — ACCU-CHEK GUIDE W/DEVICE KIT: 1.0000 | PACK | Freq: Every day | 0 refills | Status: DC

## 2017-05-09 MED ORDER — METFORMIN HCL 500 MG PO TABS: 1000.0000 mg | ORAL_TABLET | Freq: Two times a day (BID) | ORAL | 3 refills | Status: DC

## 2017-05-09 MED ORDER — ACCU-CHEK FASTCLIX LANCETS MISC: 1.0000 | Freq: Every day | 6 refills | Status: DC

## 2017-05-09 MED ORDER — LISINOPRIL 40 MG PO TABS: 40.0000 mg | ORAL_TABLET | ORAL | 2 refills | Status: DC

## 2017-05-09 MED ORDER — ROSUVASTATIN CALCIUM 10 MG PO TABS: 10.0000 mg | ORAL_TABLET | Freq: Every day | ORAL | 2 refills | Status: DC

## 2017-05-09 MED ORDER — ASPIRIN EC 81 MG PO TBEC: 81.0000 mg | DELAYED_RELEASE_TABLET | Freq: Every day | ORAL | Status: AC

## 2017-05-09 MED ORDER — ACCU-CHEK FASTCLIX LANCET KIT: 1.0000 | PACK | Freq: Every day | 0 refills | Status: DC

## 2017-05-09 NOTE — Patient Instructions (Addendum)
START Crestor 10 mg tablet- start with 1/2 tablet in the evening for 2 weeks then increase to 1 tablet.   INCREASE Metformin 500 mg to twice daily with meals for 1 week then increase to 2 tablet in the morning and 1 tablet in the evening then after 1 week increase to 2 tablets twice daily with meals for diabetes.  START Farxiga 5 mg daily for diabetes- may make you urinate a lot more in the beginning but should get better with time.   INCREASE lisinopril to 40 mg by mouth daily- take 2 of the 20 mg tablet   To check blood sugar every other morning and write this down  To significantly decrease your alcohol intake and slowly stop.   To follow up with Tivis Ringer in 2 weeks with blood sugar readings in 2 weeks  Bring blood pressure and blood sugar readings   Follow up lab in 4 weeks prior to next appt  Make sure you are taking ASA 81 mg daily

## 2017-05-09 NOTE — Progress Notes (Addendum)
Careteam: Patient Care Team: Lauree Chandler, NP as PCP - General (Geriatric Medicine) Crista Luria, MD as Attending Physician (Dermatology)  Advanced Directive information Does Patient Have a Medical Advance Directive?: No, Would patient like information on creating a medical advance directive?: No - Patient declined  Allergies  Allergen Reactions  . Penicillins Itching    Chief Complaint  Patient presents with  . Acute Visit    Pt is being seen to discuss lab results.      HPI: Patient is a 66 y.o. male seen in the office today due to abnormal lab results. Pt was seen last week to establish care and at the time had been out of pioglitazone but was taking metformin-- only taking one tablet per day.  He was also out of blood pressure medication and blood pressure was elevated during OV.  Blood pressure went home after visit to 148/91 States yesterday blood pressure was 148/91 as well.  Labs revealed a triglyceride of 1686! hgb A1c of 10.  Drinking a lot of ETOH, drinking more than he told us States "I will not take no insulin" does not matter what is wrong with me.  No nausea, vomiting, abd pain.  No fevers.   Review of Systems:  Review of Systems  Constitutional: Negative for chills, fever and weight loss.  HENT: Negative for tinnitus.   Respiratory: Negative for cough, sputum production and shortness of breath.   Cardiovascular: Negative for chest pain, palpitations and leg swelling.  Gastrointestinal: Negative for abdominal pain, constipation, diarrhea and heartburn.  Genitourinary: Negative for dysuria, frequency and urgency.  Musculoskeletal: Negative for back pain, falls, joint pain and myalgias.  Skin: Negative.   Neurological: Negative for dizziness and headaches.  Psychiatric/Behavioral: Negative for depression and memory loss. The patient does not have insomnia.     Past Medical History:  Diagnosis Date  . Arthritis   . Diabetes mellitus ([place])    type  II ; diagnosed 2006  . Gout   . Hyperlipidemia   . Hypertension    diagnosed 2006  . Inguinal hernia    Past Surgical History:  Procedure Laterality Date  . INGUINAL HERNIA REPAIR Right 04/01/2014   Procedure: LAPAROSCOPIC RIGHT  INGUINAL HERNIA REPAIR WITH MESH;  Surgeon: Ralene Ok, MD;  Location: [facility];  Service: General;  Laterality: Right;   Social History:   reports that he quit smoking about 8 years ago. His smoking use included cigarettes. He has a 40.00 pack-year smoking history. he has never used smokeless tobacco. He reports that he drinks about 1.8 oz of alcohol per week. He reports that he does not use drugs.  Family History  Problem Relation Age of Onset  . Diabetes Mother   . Hypertension Mother   . Lung cancer Mother   . Diabetes Sister   . Hypertension Sister   . Heart disease Father   . Diabetes Father   . Hypertension Father   . Colon cancer Maternal Grandfather     Medications:   Medication List        Accurate as of 05/09/17  1:49 PM. Always use your most recent med list.          amLODipine 10 MG tablet Commonly known as:  NORVASC Take 1 tablet (10 mg total) by mouth every evening.   atenolol 50 MG tablet Commonly known as:  TENORMIN Take 1 tablet (50 mg total) by mouth every morning.   colchicine 0.6 MG tablet   fenofibrate  micronized 134 MG capsule Commonly known as:  LOFIBRA   hydrocortisone cream 1 %   lisinopril 20 MG tablet Commonly known as:  PRINIVIL,ZESTRIL Take 1 tablet (20 mg total) by mouth every morning.   metFORMIN 1000 MG (MOD) 24 hr tablet Commonly known as:  GLUMETZA Take 1 tablet (1,000 mg total) by mouth daily with breakfast.   naproxen sodium 220 MG tablet Commonly known as:  ALEVE   pioglitazone 30 MG tablet Commonly known as:  ACTOS Take 1 tablet (30 mg total) by mouth daily.   ULORIC 80 MG Tabs Generic drug:  Febuxostat   VISINE EXTRA OP        Physical Exam:  Vitals:   05/09/17 1338    BP: (!) 180/124  Pulse: 91  Resp: 17  Temp: 98.3 F (36.8 C)  TempSrc: Oral  SpO2: 98%  Weight: 228 lb 12.8 oz (103.8 kg)  Height: '5\' 10"'  (1.778 m)   Body mass index is 32.83 kg/m.  Physical Exam  Constitutional: He is oriented to person, place, and time. He appears well-developed and well-nourished. No distress.  overweight  HENT:  Head: Normocephalic and atraumatic.  Mouth/Throat: Oropharynx is clear and moist. No oropharyngeal exudate.  Eyes: Conjunctivae and EOM are normal. Pupils are equal, round, and reactive to light.  Neck: Normal range of motion. Neck supple.  Cardiovascular: Normal rate, regular rhythm and normal heart sounds.  Pulmonary/Chest: Effort normal and breath sounds normal.  Abdominal: Soft. Bowel sounds are normal. He exhibits no distension and no mass. There is no tenderness. There is no guarding.  Musculoskeletal: He exhibits no edema or tenderness.  Neurological: He is alert and oriented to person, place, and time.  Skin: Skin is warm and dry. He is not diaphoretic.  Psychiatric: He has a normal mood and affect.   Labs reviewed: Basic Metabolic Panel: Recent Labs    05/04/17 0939  NA 136  K 3.7  CL 99  CO2 24  GLUCOSE 249*  BUN 12  CREATININE 1.09  CALCIUM 9.9   Liver Function Tests: Recent Labs    05/04/17 0939  AST 21  ALT 21  BILITOT 0.5  PROT 7.3   No results for input(s): LIPASE, AMYLASE in the last 8760 hours. No results for input(s): AMMONIA in the last 8760 hours. CBC: Recent Labs    05/04/17 0939  WBC 8.2  NEUTROABS 5,092  HGB 16.2  HCT 46.3  MCV 91.5  PLT 218   Lipid Panel: Recent Labs    05/04/17 0939  CHOL 325*  HDL 33*  TRIG 1,686*  CHOLHDL 9.8*   TSH: No results for input(s): TSH in the last 8760 hours. A1C: Lab Results  Component Value Date   HGBA1C 10.0 (H) 05/04/2017     Assessment/Plan 1. Essential hypertension -suspect some white coat syndrome but still not controlled based on home  readings he has given from recall. Cont DASH diet with norvasc, atenolol and will increase lisinopril to 40 mg daily  - lisinopril (PRINIVIL,ZESTRIL) 40 MG tablet; Take 1 tablet (40 mg total) by mouth every morning.  Dispense: 30 tablet; Refill: 2 - BMP with eGFR; Future - aspirin EC 81 MG tablet; Take 1 tablet (81 mg total) by mouth daily.  2. Hyperlipidemia associated with type 2 diabetes mellitus ([place]) Triglycerides dangerously high, went into details about the risk and symptoms of pancreatitis. He is adamant not wish to start on insulin to help lower his blood sugars despite risk. States he is aware that  his drinking bourbon and poor medication compliance has led him to this and is willing to make changes to improve. Encouraged to significantly cut back on ETOH intake and wean himself off. Also education provided about dietary changes.  Will also add statin due to elevated cholesterol and diabetes.  - rosuvastatin (CRESTOR) 10 MG tablet; Take 1 tablet (10 mg total) by mouth daily.  Dispense: 30 tablet; Refill: 2  To start crestor 1/2 tablet x 2 weeks then to increase to 1 tablet.  Plan reviewed with Dr Eulas Post, will also start Lovaza 2 gm twice daily and stop fenofibrate for now.  - Lipid Panel; Future  3. Type 2 diabetes mellitus with hyperglycemia, without long-term current use of insulin ([facility]) Uncontrolled. Diet modifications encouraged. Pt will not start insulin despite the extensive education provided due to elevated A1c and triglycerides. At high risk for pancreatitis therefore will increase metformin and add farxiga and avoid GLP-1 and Dpp-4.  -to increase metformin from 500 mg daily to 1000 mg by mouth twice daily over the next few weeks.  - metFORMIN (GLUCOPHAGE) 500 MG tablet; Take 2 tablets (1,000 mg total) by mouth 2 (two) times daily with a meal.  Dispense: 180 tablet; Refill: 3 - to start [place] as well. Samples given. -dapagliflozin propanediol (FARXIGA) 5 MG TABS tablet; Take 5  mg by mouth daily.  Dispense: 30 tablet; Refill: 3 - aspirin EC 81 MG tablet; Take 1 tablet (81 mg total) by mouth daily.  4. Screening PSA (prostate specific antigen) - PSA; Future  Pt give strict precautions as to when to seek ER care.   Next appt: 2 weeks with [facility], to bring blood sugar and blood pressure readings.  4 weeks with me. To have fasting lab work prior to visit.  Carlos American. Harle Battiest  [facility] [phone] 8 am - 5 pm) [phone] (after hours)

## 2017-05-09 NOTE — Addendum Note (Signed)
Addended by: Lauree Chandler on: 05/09/2017 04:15 PM   Modules accepted: Orders

## 2017-05-09 NOTE — Addendum Note (Signed)
Addended by: Denyse Amass on: 05/09/2017 11:46 AM   Modules accepted: Orders

## 2017-05-10 ENCOUNTER — Telehealth: Payer: Self-pay | Admitting: *Deleted

## 2017-05-10 NOTE — Telephone Encounter (Signed)
Received fax from [employer] [phone] stating that Metformin is on plan's list of covered drugs. Prior authorization is not required.  Patient VB:[id number]

## 2017-05-10 NOTE — Telephone Encounter (Signed)
Received fax from [employer] stating medication is APPROVED through 06/13/2018.  Patient UI:[id number]

## 2017-05-10 NOTE — Telephone Encounter (Signed)
Received prior authorization for Farxiga from [employer]. Initiated Prior authorization [id number] SP#:[id number] TVG:[id number] Awaiting Determination within 72 hours.

## 2017-05-10 NOTE — Telephone Encounter (Signed)
Received fax from [employer] requesting prior authorization for Metformin. Initiated.  [id number] TTC:[id number] Awaiting Determination within 72 hours.

## 2017-05-11 ENCOUNTER — Ambulatory Visit: Payer: Medicare Other | Admitting: Nurse Practitioner

## 2017-05-23 ENCOUNTER — Ambulatory Visit: Payer: Medicare Other | Admitting: Pharmacotherapy

## 2017-06-08 ENCOUNTER — Ambulatory Visit: Payer: Medicare Other

## 2017-06-10 ENCOUNTER — Other Ambulatory Visit: Payer: Medicare Other

## 2017-06-10 DIAGNOSIS — E785 Hyperlipidemia, unspecified: Secondary | ICD-10-CM

## 2017-06-10 DIAGNOSIS — I10 Essential (primary) hypertension: Secondary | ICD-10-CM

## 2017-06-10 DIAGNOSIS — Z125 Encounter for screening for malignant neoplasm of prostate: Secondary | ICD-10-CM

## 2017-06-10 DIAGNOSIS — E1169 Type 2 diabetes mellitus with other specified complication: Secondary | ICD-10-CM

## 2017-06-10 LAB — BASIC METABOLIC PANEL WITH GFR
BUN: 12 mg/dL (ref 7–25)
CALCIUM: 9.7 mg/dL (ref 8.6–10.3)
CO2: 26 mmol/L (ref 20–32)
Chloride: 102 mmol/L (ref 98–110)
Creat: 1.15 mg/dL (ref 0.70–1.25)
GFR, EST AFRICAN AMERICAN: 77 mL/min/{1.73_m2} (ref >=60)
GFR, EST NON AFRICAN AMERICAN: 66 mL/min/{1.73_m2} (ref >=60)
Glucose, Bld: 158 mg/dL — ABNORMAL HIGH (ref 65–99)
POTASSIUM: 3.9 mmol/L (ref 3.5–5.3)
Sodium: 140 mmol/L (ref 135–146)

## 2017-06-10 LAB — LIPID PANEL
CHOL/HDL RATIO: 3.1 (calc) (ref <5.0)
Cholesterol: 138 mg/dL (ref <200)
HDL: 44 mg/dL (ref >40)
NON-HDL CHOLESTEROL (CALC): 94 mg/dL (ref <130)
TRIGLYCERIDES: 530 mg/dL — AB (ref <150)

## 2017-06-10 LAB — PSA: PSA: 2.4 ng/mL (ref <=4.0)

## 2017-06-15 ENCOUNTER — Encounter: Payer: Self-pay | Admitting: Nurse Practitioner

## 2017-06-15 ENCOUNTER — Ambulatory Visit (INDEPENDENT_AMBULATORY_CARE_PROVIDER_SITE_OTHER): Payer: Medicare Other | Admitting: Nurse Practitioner

## 2017-06-15 VITALS — BP 186/94 | HR 95 | Temp 99.0°F | Resp 17 | Ht 70.0 in | Wt 228.0 lb

## 2017-06-15 DIAGNOSIS — L209 Atopic dermatitis, unspecified: Secondary | ICD-10-CM | POA: Diagnosis not present

## 2017-06-15 DIAGNOSIS — I10 Essential (primary) hypertension: Secondary | ICD-10-CM

## 2017-06-15 DIAGNOSIS — E1169 Type 2 diabetes mellitus with other specified complication: Secondary | ICD-10-CM

## 2017-06-15 DIAGNOSIS — Z125 Encounter for screening for malignant neoplasm of prostate: Secondary | ICD-10-CM

## 2017-06-15 DIAGNOSIS — E1165 Type 2 diabetes mellitus with hyperglycemia: Secondary | ICD-10-CM | POA: Diagnosis not present

## 2017-06-15 DIAGNOSIS — Z0000 Encounter for general adult medical examination without abnormal findings: Secondary | ICD-10-CM

## 2017-06-15 DIAGNOSIS — M1A9XX0 Chronic gout, unspecified, without tophus (tophi): Secondary | ICD-10-CM

## 2017-06-15 DIAGNOSIS — E785 Hyperlipidemia, unspecified: Secondary | ICD-10-CM

## 2017-06-15 MED ORDER — CRISABOROLE 2 % EX OINT: 1.0000 "application " | TOPICAL_OINTMENT | Freq: Two times a day (BID) | CUTANEOUS | Status: DC | PRN

## 2017-06-15 MED ORDER — FEBUXOSTAT 40 MG PO TABS: 40.0000 mg | ORAL_TABLET | Freq: Every day | ORAL | 2 refills | Status: DC

## 2017-06-15 NOTE — Patient Instructions (Addendum)
Start uloric 40 mg by mouth DAILY for gout prevention   Start 30 mins of cardiovascular activity 5 days a week.  Really try to work on diet  eucrisa- cream twice daily as needed to effected area  Follow up in 2 months with Dr Eulas Post, then you will continue to see Curtis Allison ( 5 months) for routine follow up, labs prior to visit.   DASH Eating Plan DASH stands for "Dietary Approaches to Stop Hypertension." The DASH eating plan is a healthy eating plan that has been shown to reduce high blood pressure (hypertension). It may also reduce your risk for type 2 diabetes, heart disease, and stroke. The DASH eating plan may also help with weight loss. What are tips for following this plan? General guidelines  Avoid eating more than 2,300 mg (milligrams) of salt (sodium) a day. If you have hypertension, you may need to reduce your sodium intake to 1,500 mg a day.  Limit alcohol intake to no more than 1 drink a day for nonpregnant women and 2 drinks a day for men. One drink equals 12 oz of beer, 5 oz of wine, or 1 oz of hard liquor.  Work with your health care provider to maintain a healthy body weight or to lose weight. Ask what an ideal weight is for you.  Get at least 30 minutes of exercise that causes your heart to beat faster (aerobic exercise) most days of the week. Activities may include walking, swimming, or biking.  Work with your health care provider or diet and nutrition specialist (dietitian) to adjust your eating plan to your individual calorie needs. Reading food labels  Check food labels for the amount of sodium per serving. Choose foods with less than 5 percent of the Daily Value of sodium. Generally, foods with less than 300 mg of sodium per serving fit into this eating plan.  To find whole grains, look for the word "whole" as the first word in the ingredient list. Shopping  Buy products labeled as "low-sodium" or "no salt added."  Buy fresh foods. Avoid canned foods and premade  or frozen meals. Cooking  Avoid adding salt when cooking. Use salt-free seasonings or herbs instead of table salt or sea salt. Check with your health care provider or pharmacist before using salt substitutes.  Do not fry foods. Cook foods using healthy methods such as baking, boiling, grilling, and broiling instead.  Cook with heart-healthy oils, such as olive, canola, soybean, or sunflower oil. Meal planning   Eat a balanced diet that includes: ? 5 or more servings of fruits and vegetables each day. At each meal, try to fill half of your plate with fruits and vegetables. ? Up to 6-8 servings of whole grains each day. ? Less than 6 oz of lean meat, poultry, or fish each day. A 3-oz serving of meat is about the same size as a deck of cards. One egg equals 1 oz. ? 2 servings of low-fat dairy each day. ? A serving of nuts, seeds, or beans 5 times each week. ? Heart-healthy fats. Healthy fats called Omega-3 fatty acids are found in foods such as flaxseeds and coldwater fish, like sardines, salmon, and mackerel.  Limit how much you eat of the following: ? Canned or prepackaged foods. ? Food that is high in trans fat, such as fried foods. ? Food that is high in saturated fat, such as fatty meat. ? Sweets, desserts, sugary drinks, and other foods with added sugar. ? Full-fat dairy products.  Do not salt foods before eating.  Try to eat at least 2 vegetarian meals each week.  Eat more home-cooked food and less restaurant, buffet, and fast food.  When eating at a restaurant, ask that your food be prepared with less salt or no salt, if possible. What foods are recommended? The items listed may not be a complete list. Talk with your dietitian about what dietary choices are best for you. Grains Whole-grain or whole-wheat bread. Whole-grain or whole-wheat pasta. Brown rice. [place]. Bulgur. Whole-grain and low-sodium cereals. Pita bread. Low-fat, low-sodium crackers. Whole-wheat flour  tortillas. Vegetables Fresh or frozen vegetables (raw, steamed, roasted, or grilled). Low-sodium or reduced-sodium tomato and vegetable juice. Low-sodium or reduced-sodium tomato sauce and tomato paste. Low-sodium or reduced-sodium canned vegetables. Fruits All fresh, dried, or frozen fruit. Canned fruit in natural juice (without added sugar). Meat and other protein foods Skinless chicken or [place]. Ground chicken or [place]. Pork with fat trimmed off. Fish and seafood. Egg whites. Dried beans, peas, or lentils. Unsalted nuts, nut butters, and seeds. Unsalted canned beans. Lean cuts of beef with fat trimmed off. Low-sodium, lean deli meat. Dairy Low-fat (1%) or fat-free (skim) milk. Fat-free, low-fat, or reduced-fat cheeses. Nonfat, low-sodium ricotta or cottage cheese. Low-fat or nonfat yogurt. Low-fat, low-sodium cheese. Fats and oils Soft margarine without trans fats. Vegetable oil. Low-fat, reduced-fat, or light mayonnaise and salad dressings (reduced-sodium). Canola, safflower, olive, soybean, and sunflower oils. Avocado. Seasoning and other foods Herbs. Spices. Seasoning mixes without salt. Unsalted popcorn and pretzels. Fat-free sweets. What foods are not recommended? The items listed may not be a complete list. Talk with your dietitian about what dietary choices are best for you. Grains Baked goods made with fat, such as croissants, muffins, or some breads. Dry pasta or rice meal packs. Vegetables Creamed or fried vegetables. Vegetables in a cheese sauce. Regular canned vegetables (not low-sodium or reduced-sodium). Regular canned tomato sauce and paste (not low-sodium or reduced-sodium). Regular tomato and vegetable juice (not low-sodium or reduced-sodium). Curtis Allison. Olives. Fruits Canned fruit in a light or heavy syrup. Fried fruit. Fruit in cream or butter sauce. Meat and other protein foods Fatty cuts of meat. Ribs. Fried meat. Curtis Allison. Sausage. Bologna and other processed lunch meats.  Salami. Fatback. Hotdogs. Bratwurst. Salted nuts and seeds. Canned beans with added salt. Canned or smoked fish. Whole eggs or egg yolks. Chicken or [place] with skin. Dairy Whole or 2% milk, cream, and half-and-half. Whole or full-fat cream cheese. Whole-fat or sweetened yogurt. Full-fat cheese. Nondairy creamers. Whipped toppings. Processed cheese and cheese spreads. Fats and oils Butter. Stick margarine. Lard. Shortening. Ghee. Bacon fat. Tropical oils, such as coconut, palm kernel, or palm oil. Seasoning and other foods Salted popcorn and pretzels. Onion salt, garlic salt, seasoned salt, table salt, and sea salt. Worcestershire sauce. Tartar sauce. Barbecue sauce. Teriyaki sauce. Soy sauce, including reduced-sodium. Steak sauce. Canned and packaged gravies. Fish sauce. Oyster sauce. Cocktail sauce. Horseradish that you find on the shelf. Ketchup. Mustard. Meat flavorings and tenderizers. Bouillon cubes. Hot sauce and Tabasco sauce. Premade or packaged marinades. Premade or packaged taco seasonings. Relishes. Regular salad dressings. Where to find more information:  National Heart, Lung, and [place]: https://wilson-eaton.com/  American Heart Association: www.heart.org Summary  The DASH eating plan is a healthy eating plan that has been shown to reduce high blood pressure (hypertension). It may also reduce your risk for type 2 diabetes, heart disease, and stroke.  With the DASH eating plan, you should limit salt (sodium)  intake to 2,300 mg a day. If you have hypertension, you may need to reduce your sodium intake to 1,500 mg a day.  When on the DASH eating plan, aim to eat more fresh fruits and vegetables, whole grains, lean proteins, low-fat dairy, and heart-healthy fats.  Work with your health care provider or diet and nutrition specialist (dietitian) to adjust your eating plan to your individual calorie needs. This information is not intended to replace advice given to you by your health  care provider. Make sure you discuss any questions you have with your health care provider. Document Released: 05/20/2011 Document Revised: 05/24/2016 Document Reviewed: 05/24/2016 Elsevier Interactive Patient Education  2018 [place] Maintenance, Male A healthy lifestyle and preventive care is important for your health and wellness. Ask your health care provider about what schedule of regular examinations is right for you. What should I know about weight and diet? Eat a Healthy Diet  Eat plenty of vegetables, fruits, whole grains, low-fat dairy products, and lean protein.  Do not eat a lot of foods high in solid fats, added sugars, or salt.  Maintain a Healthy Weight Regular exercise can help you achieve or maintain a healthy weight. You should:  Do at least 150 minutes of exercise each week. The exercise should increase your heart rate and make you sweat (moderate-intensity exercise).  Do strength-training exercises at least twice a week.  Watch Your Levels of Cholesterol and Blood Lipids  Have your blood tested for lipids and cholesterol every 5 years starting at 66 years of age. If you are at high risk for heart disease, you should start having your blood tested when you are 66 years old. You may need to have your cholesterol levels checked more often if: ? Your lipid or cholesterol levels are high. ? You are older than 66 years of age. ? You are at high risk for heart disease.  What should I know about cancer screening? Many types of cancers can be detected early and may often be prevented. Lung Cancer  You should be screened every year for lung cancer if: ? You are a current smoker who has smoked for at least 30 years. ? You are a former smoker who has quit within the past 15 years.  Talk to your health care provider about your screening options, when you should start screening, and how often you should be screened.  Colorectal Cancer  Routine colorectal  cancer screening usually begins at 66 years of age and should be repeated every 5-10 years until you are 66 years old. You may need to be screened more often if early forms of precancerous polyps or small growths are found. Your health care provider may recommend screening at an earlier age if you have risk factors for colon cancer.  Your health care provider may recommend using home test kits to check for hidden blood in the stool.  A small camera at the end of a tube can be used to examine your colon (sigmoidoscopy or colonoscopy). This checks for the earliest forms of colorectal cancer.  Prostate and Testicular Cancer  Depending on your age and overall health, your health care provider may do certain tests to screen for prostate and testicular cancer.  Talk to your health care provider about any symptoms or concerns you have about testicular or prostate cancer.  Skin Cancer  Check your skin from head to toe regularly.  Tell your health care provider about any new moles or changes  in moles, especially if: ? There is a change in a mole's size, shape, or color. ? You have a mole that is larger than a pencil eraser.  Always use sunscreen. Apply sunscreen liberally and repeat throughout the day.  Protect yourself by wearing long sleeves, pants, a wide-brimmed hat, and sunglasses when outside.  What should I know about heart disease, diabetes, and high blood pressure?  If you are 62-73 years of age, have your blood pressure checked every 3-5 years. If you are 63 years of age or older, have your blood pressure checked every year. You should have your blood pressure measured twice-once when you are at a hospital or clinic, and once when you are not at a hospital or clinic. Record the average of the two measurements. To check your blood pressure when you are not at a hospital or clinic, you can use: ? An automated blood pressure machine at a pharmacy. ? A home blood pressure monitor.  Talk to  your health care provider about your target blood pressure.  If you are between 51-11 years old, ask your health care provider if you should take aspirin to prevent heart disease.  Have regular diabetes screenings by checking your fasting blood sugar level. ? If you are at a normal weight and have a low risk for diabetes, have this test once every three years after the age of 36. ? If you are overweight and have a high risk for diabetes, consider being tested at a younger age or more often.  A one-time screening for abdominal aortic aneurysm (AAA) by ultrasound is recommended for men aged 82-75 years who are current or former smokers. What should I know about preventing infection? Hepatitis B If you have a higher risk for hepatitis B, you should be screened for this virus. Talk with your health care provider to find out if you are at risk for hepatitis B infection. Hepatitis C Blood testing is recommended for:  Everyone born from [birth year] through 1964.  Anyone with known risk factors for hepatitis C.  Sexually Transmitted Diseases (STDs)  You should be screened each year for STDs including gonorrhea and chlamydia if: ? You are sexually active and are younger than 66 years of age. ? You are older than 66 years of age and your health care provider tells you that you are at risk for this type of infection. ? Your sexual activity has changed since you were last screened and you are at an increased risk for chlamydia or gonorrhea. Ask your health care provider if you are at risk.  Talk with your health care provider about whether you are at high risk of being infected with HIV. Your health care provider may recommend a prescription medicine to help prevent HIV infection.  What else can I do?  Schedule regular health, dental, and eye exams.  Stay current with your vaccines (immunizations).  Do not use any tobacco products, such as cigarettes, chewing tobacco, and e-cigarettes. If you need  help quitting, ask your health care provider.  Limit alcohol intake to no more than 2 drinks per day. One drink equals 12 ounces of beer, 5 ounces of wine, or 1 ounces of hard liquor.  Do not use street drugs.  Do not share needles.  Ask your health care provider for help if you need support or information about quitting drugs.  Tell your health care provider if you often feel depressed.  Tell your health care provider if you have ever been  abused or do not feel safe at home. This information is not intended to replace advice given to you by your health care provider. Make sure you discuss any questions you have with your health care provider. Document Released: 11/27/2007 Document Revised: 01/28/2016 Document Reviewed: 03/04/2015 Elsevier Interactive Patient Education  [employer].

## 2017-06-15 NOTE — Progress Notes (Signed)
Careteam: Patient Care Team: Lauree Chandler, NP as PCP - General (Geriatric Medicine) Crista Luria, MD as Attending Physician (Dermatology)  Advanced Directive information Does Patient Have a Medical Advance Directive?: No, Would patient like information on creating a medical advance directive?: Yes (MAU/Ambulatory/Procedural Areas - Information given)  Allergies  Allergen Reactions  . Penicillins Itching    Chief Complaint  Patient presents with  . Medical Management of Chronic Issues    Pt is being seen for a complete physical and management of chronic conditions. Pt would like to discuss possible side effects of farxiga.      HPI: Patient is a 67 y.o. male seen in the office today for extended visit.   DM-A1c 10.0 1 month ago. He was increased from metformin 500 mg daily to 1000 mg twice daily and farziga 5 mg by mouth was added. He was adamant against starting insulin. Blood sugars ranging from 169-197.  Pt also on actos. Pt has not been taking farziga routinely, taking it maybe every other day. Trying to work on diet. States he does not eat "a lot of sweets but does get an urge" eats a lot of snacky foods. Plans to get eye exam, reports "in the works"   HTN- lisinopril was increase to 40 mg daily, pt also on norvasc, atenolol. Blood pressures at home. Ranging from 137-142/72-91  Hyperlipidemia- added lovaza 2 tablets twice daily (sometimes misses doses) and crestor 10 mg. No side effects noted from this.   Gout- flare prior to christmas, did not last long. Does not take anything routinely for gout. Colchicine and uloric as needed for flare  Overdue for colonoscopy- would like to hold off on this, right now he would like to focus on getting other medical issues controlled.   Does not wish to get vaccines.  Review of Systems:  Review of Systems  Constitutional: Negative for chills, fever and weight loss.  HENT: Negative for tinnitus.   Eyes: Negative for blurred  vision, double vision and photophobia.  Respiratory: Negative for cough, sputum production and shortness of breath.   Cardiovascular: Negative for chest pain, palpitations and leg swelling.  Gastrointestinal: Negative for abdominal pain, constipation, diarrhea and heartburn.  Genitourinary: Negative for dysuria, frequency and urgency.  Musculoskeletal: Negative for back pain, falls, joint pain and myalgias.  Skin: Positive for itching and rash.       Reports hx of eczema   Neurological: Negative for dizziness and headaches.  Psychiatric/Behavioral: Negative for depression and memory loss. The patient does not have insomnia.     Past Medical History:  Diagnosis Date  . Arthritis   . Diabetes mellitus ([place])    type II ; diagnosed 2006  . Gout   . Hyperlipidemia   . Hypertension    diagnosed 2006  . Inguinal hernia    Past Surgical History:  Procedure Laterality Date  . INGUINAL HERNIA REPAIR Right 04/01/2014   Procedure: LAPAROSCOPIC RIGHT  INGUINAL HERNIA REPAIR WITH MESH;  Surgeon: Ralene Ok, MD;  Location: [facility];  Service: General;  Laterality: Right;   Social History:   reports that he quit smoking about 8 years ago. His smoking use included cigarettes. He has a 40.00 pack-year smoking history. he has never used smokeless tobacco. He reports that he drinks about 1.8 oz of alcohol per week. He reports that he does not use drugs.  Family History  Problem Relation Age of Onset  . Diabetes Mother   . Hypertension Mother   .  Lung cancer Mother   . Diabetes Sister   . Hypertension Sister   . Heart disease Father   . Diabetes Father   . Hypertension Father   . Colon cancer Maternal Grandfather     Medications:   Medication List        Accurate as of 06/15/17  1:48 PM. Always use your most recent med list.          ACCU-CHEK FASTCLIX LANCET Kit 1 each by Other route daily. Dx:E11.65   ACCU-CHEK FASTCLIX LANCETS Misc 1 each by Other route daily. Dx: E11.65     ACCU-CHEK GUIDE w/Device Kit 1 each by Other route daily. Dx: E11.65   amLODipine 10 MG tablet Commonly known as:  NORVASC Take 1 tablet (10 mg total) by mouth every evening.   aspirin EC 81 MG tablet Take 1 tablet (81 mg total) by mouth daily.   atenolol 50 MG tablet Commonly known as:  TENORMIN Take 1 tablet (50 mg total) by mouth every morning.   colchicine 0.6 MG tablet   dapagliflozin propanediol 5 MG Tabs tablet Commonly known as:  FARXIGA Take 5 mg by mouth daily.   fenofibrate micronized 134 MG capsule Commonly known as:  LOFIBRA   glucose blood test strip Commonly known as:  ACCU-CHEK GUIDE 1 each by Other route daily. Dx: E11.65   hydrocortisone cream 1 %   lisinopril 40 MG tablet Commonly known as:  PRINIVIL,ZESTRIL Take 1 tablet (40 mg total) by mouth every morning.   metFORMIN 500 MG tablet Commonly known as:  GLUCOPHAGE Take 2 tablets (1,000 mg total) by mouth 2 (two) times daily with a meal.   naproxen sodium 220 MG tablet Commonly known as:  ALEVE   omega-3 acid ethyl esters 1 g capsule Commonly known as:  LOVAZA Take 2 capsules (2 g total) by mouth 2 (two) times daily.   pioglitazone 30 MG tablet Commonly known as:  ACTOS Take 1 tablet (30 mg total) by mouth daily.   rosuvastatin 10 MG tablet Commonly known as:  CRESTOR Take 1 tablet (10 mg total) by mouth daily.   ULORIC 80 MG Tabs Generic drug:  Febuxostat   VISINE EXTRA OP        Physical Exam:  Vitals:   06/15/17 1339  BP: (!) 186/94  Pulse: 95  Resp: 17  Temp: 99 F (37.2 C)  TempSrc: Oral  SpO2: 97%  Weight: 228 lb (103.4 kg)  Height: '5\' 10"'  (1.778 m)   Body mass index is 32.71 kg/m.  Physical Exam  Constitutional: He is oriented to person, place, and time. He appears well-developed and well-nourished. No distress.  overweight  HENT:  Head: Normocephalic and atraumatic.  Mouth/Throat: Oropharynx is clear and moist. No oropharyngeal exudate.  Eyes:  Conjunctivae and EOM are normal. Pupils are equal, round, and reactive to light.  Neck: Normal range of motion. Neck supple.  Cardiovascular: Normal rate, regular rhythm and normal heart sounds.  Pulmonary/Chest: Effort normal and breath sounds normal.  Abdominal: Soft. Bowel sounds are normal. He exhibits no distension and no mass. There is no tenderness. There is no guarding.  Musculoskeletal: He exhibits no edema or tenderness.  Neurological: He is alert and oriented to person, place, and time.  Skin: Skin is warm and dry. Rash noted. He is not diaphoretic.  Psychiatric: He has a normal mood and affect.    Labs reviewed: Basic Metabolic Panel: Recent Labs    09/06/16 05/04/17 0939 06/10/17 0839  NA 136*  136 140  K 3.6 3.7 3.9  CL  --  99 102  CO2  --  24 26  GLUCOSE  --  249* 158*  BUN '11 12 12  ' CREATININE 1.1 1.09 1.15  CALCIUM  --  9.9 9.7   Liver Function Tests: Recent Labs    05/04/17 0939  AST 21  ALT 21  BILITOT 0.5  PROT 7.3   No results for input(s): LIPASE, AMYLASE in the last 8760 hours. No results for input(s): AMMONIA in the last 8760 hours. CBC: Recent Labs    05/04/17 0939  WBC 8.2  NEUTROABS 5,092  HGB 16.2  HCT 46.3  MCV 91.5  PLT 218   Lipid Panel: Recent Labs    05/04/17 0939 06/10/17 0839  CHOL 325* 138  HDL 33* 44  TRIG 1,686* 530*  CHOLHDL 9.8* 3.1   TSH: No results for input(s): TSH in the last 8760 hours. A1C: Lab Results  Component Value Date   HGBA1C 10.0 (H) 05/04/2017     Assessment/Plan 1. Hyperlipidemia associated with type 2 diabetes mellitus (HCC) Triglycerides remain elevated but has improved. Will cont current regimen and dietary modifications. To follow up labs prior to next appt.  - Lipid Panel; Future  2. Type 2 diabetes mellitus with hyperglycemia, without long-term current use of insulin (HCC) -blood sugars remain elevated but only taking farxiga every other day. Encouraged to take this daily and he is  in agreement. To cont actos with metformin 1000 mg BID. Does not want to start insulin and discussed risk of pancreatitis due to elevated triglycerides, these have improved but not at goal.  To cont medication and again reinforced diet modifications.  - Hemoglobin A1c; Future  3. Screening PSA (prostate specific antigen) -at goal on lab.   4. Essential hypertension -elevated, however home readings are better however not consistently at goal. Discussed dietary modifications and to cont to check blood pressure at home. If blood pressure staying over 140/90 to notify us and may need to increase atenolol to 100 mg by mouth daily  - CMP with eGFR; Future  5. Chronic gout without tophus, unspecified cause, unspecified site Uric acid elevated but not on daily medication. Will start uloric daily and to use colchicine as needed flare.  - febuxostat (ULORIC) 40 MG tablet; Take 1 tablet (40 mg total) by mouth daily.  Dispense: 30 tablet; Refill: 2 - Uric acid; Future  6. Atopic dermatitis, unspecified type -previously seeing dermatology for eczema  - Crisaborole (EUCRISA) 2 % OINT; Apply 1 application topically 2 (two) times daily as needed.  7. Wellness examination The patient was counseled regarding the appropriate use of alcohol, regular self-examination of the breasts on a monthly basis, prevention of dental and periodontal disease, diet, regular sustained exercise for at least 30 minutes 5 times per week, testicular self-examination on a monthly basis,smoking cessation, tobacco use,  and recommended schedule for GI hemoccult testing, colonoscopy, cholesterol, thyroid and diabetes screening. -pt is looking to do lifestyle modifications along with medication to meet his health care goals.  Does not wish to have a nutritional referral at this time.  -plans to get colonoscopy this year but wants to hold off for now.    Next appt: 2 months with Dr Eulas Post, 5 months with myself.  Carlos American. Harle Battiest  [facility] [phone] 8 am - 5 pm) [phone] (after hours)

## 2017-06-16 ENCOUNTER — Telehealth: Payer: Self-pay | Admitting: *Deleted

## 2017-06-16 NOTE — Telephone Encounter (Signed)
Received fax for Prior Authorization for Uloric 40mg .  Key: [id number] Initiated through CoverMyMeds. [id number]. Went into review. Determination in 48-72 hours.

## 2017-06-21 MED ORDER — ALLOPURINOL 100 MG PO TABS: 100.0000 mg | ORAL_TABLET | Freq: Every day | ORAL | 6 refills | Status: DC

## 2017-06-21 NOTE — Telephone Encounter (Signed)
To stop uloric, to start allopurinol 100 mg by mouth daily for gout, we will follow up uric acid level at upcoming appt.

## 2017-06-21 NOTE — Telephone Encounter (Signed)
Patient aware of instructions and verbalized understanding.

## 2017-06-21 NOTE — Addendum Note (Signed)
Addended by: Lauree Chandler on: 06/21/2017 10:04 AM   Modules accepted: Orders

## 2017-06-21 NOTE — Telephone Encounter (Signed)
Incoming fax received indicating PA for Uloric denied.   Uloric was denied for not meeting step therapy requirements. Medication authorization requires that you try allopurinol or give specific medical reasons why alternative is not appropriate.   Please advise

## 2017-07-21 ENCOUNTER — Other Ambulatory Visit: Payer: Self-pay

## 2017-07-21 DIAGNOSIS — M1A9XX0 Chronic gout, unspecified, without tophus (tophi): Secondary | ICD-10-CM

## 2017-08-09 ENCOUNTER — Other Ambulatory Visit (INDEPENDENT_AMBULATORY_CARE_PROVIDER_SITE_OTHER): Payer: Medicare Other

## 2017-08-09 DIAGNOSIS — E785 Hyperlipidemia, unspecified: Secondary | ICD-10-CM

## 2017-08-09 DIAGNOSIS — M1A9XX0 Chronic gout, unspecified, without tophus (tophi): Secondary | ICD-10-CM

## 2017-08-09 DIAGNOSIS — E1169 Type 2 diabetes mellitus with other specified complication: Secondary | ICD-10-CM

## 2017-08-09 DIAGNOSIS — I10 Essential (primary) hypertension: Secondary | ICD-10-CM

## 2017-08-09 DIAGNOSIS — E1165 Type 2 diabetes mellitus with hyperglycemia: Secondary | ICD-10-CM

## 2017-08-10 LAB — COMPLETE METABOLIC PANEL WITH GFR
AG RATIO: 1.8 (calc) (ref 1.0–2.5)
ALKALINE PHOSPHATASE (APISO): 63 U/L (ref 40–115)
ALT: 18 U/L (ref 9–46)
AST: 18 U/L (ref 10–35)
Albumin: 4.4 g/dL (ref 3.6–5.1)
BILIRUBIN TOTAL: 0.7 mg/dL (ref 0.2–1.2)
BUN: 12 mg/dL (ref 7–25)
CHLORIDE: 101 mmol/L (ref 98–110)
CO2: 28 mmol/L (ref 20–32)
Calcium: 9.7 mg/dL (ref 8.6–10.3)
Creat: 1 mg/dL (ref 0.70–1.25)
GFR, EST AFRICAN AMERICAN: 91 mL/min/{1.73_m2} (ref >=60)
GFR, Est Non African American: 79 mL/min/{1.73_m2} (ref >=60)
Globulin: 2.5 g/dL (calc) (ref 1.9–3.7)
Glucose, Bld: 219 mg/dL — ABNORMAL HIGH (ref 65–99)
POTASSIUM: 3.5 mmol/L (ref 3.5–5.3)
Sodium: 140 mmol/L (ref 135–146)
TOTAL PROTEIN: 6.9 g/dL (ref 6.1–8.1)

## 2017-08-10 LAB — LIPID PANEL
CHOLESTEROL: 146 mg/dL (ref <200)
HDL: 45 mg/dL (ref >40)
Non-HDL Cholesterol (Calc): 101 mg/dL (calc) (ref <130)
TRIGLYCERIDES: 484 mg/dL — AB (ref <150)
Total CHOL/HDL Ratio: 3.2 (calc) (ref <5.0)

## 2017-08-10 LAB — HEMOGLOBIN A1C
HEMOGLOBIN A1C: 8.6 %{Hb} — AB (ref <5.7)
MEAN PLASMA GLUCOSE: 200 (calc)
eAG (mmol/L): 11.1 (calc)

## 2017-08-10 LAB — URIC ACID: URIC ACID, SERUM: 6.4 mg/dL (ref 4.0–8.0)

## 2017-08-12 ENCOUNTER — Encounter: Payer: Self-pay | Admitting: Internal Medicine

## 2017-08-12 ENCOUNTER — Ambulatory Visit (INDEPENDENT_AMBULATORY_CARE_PROVIDER_SITE_OTHER): Payer: Medicare Other | Admitting: Internal Medicine

## 2017-08-12 VITALS — BP 162/80 | HR 92 | Temp 98.0°F | Ht 70.0 in | Wt 233.0 lb

## 2017-08-12 DIAGNOSIS — M1A9XX0 Chronic gout, unspecified, without tophus (tophi): Secondary | ICD-10-CM | POA: Diagnosis not present

## 2017-08-12 DIAGNOSIS — L209 Atopic dermatitis, unspecified: Secondary | ICD-10-CM | POA: Diagnosis not present

## 2017-08-12 DIAGNOSIS — E1169 Type 2 diabetes mellitus with other specified complication: Secondary | ICD-10-CM

## 2017-08-12 DIAGNOSIS — E1165 Type 2 diabetes mellitus with hyperglycemia: Secondary | ICD-10-CM

## 2017-08-12 DIAGNOSIS — I10 Essential (primary) hypertension: Secondary | ICD-10-CM

## 2017-08-12 DIAGNOSIS — E785 Hyperlipidemia, unspecified: Secondary | ICD-10-CM

## 2017-08-12 MED ORDER — ROSUVASTATIN CALCIUM 10 MG PO TABS: 10.0000 mg | ORAL_TABLET | Freq: Every day | ORAL | 1 refills | Status: DC

## 2017-08-12 MED ORDER — AMLODIPINE BESYLATE 10 MG PO TABS: 10.0000 mg | ORAL_TABLET | Freq: Every evening | ORAL | 1 refills | Status: DC

## 2017-08-12 NOTE — Progress Notes (Signed)
Patient ID: Curtis Allison, male   DOB: 10/22/1950, 67 y.o.   MRN: [id number]   Location:  [facility] OFFICE  Provider: Dr Arletha Grippe  Code Status:  Goals of Care:  Advanced Directives 08/12/2017  Does Patient Have a Medical Advance Directive? No  Would patient like information on creating a medical advance directive? Yes (MAU/Ambulatory/Procedural Areas - Information given)     Chief Complaint  Patient presents with  . Medical Management of Chronic Issues    2 month follow-up, discuss labs (copy printed)   . Rash    Off/on rash- ongoing concern   . Medication Management    Patient stopped Lovaza due to dizziness, since stopping dizziness improved. Please advise if patient to remain off medication   . Medication Refill    Crestor and Amlodipine- 90 day supply   . Health Maintenance    Patient will set up eye exam, established with eye doctor([facility] Refused Colonoscopy     HPI: Patient is a 67 y.o. male seen today for medical management of chronic diseases.  He stopped lovaza 2 weeks ago due to c/a it causing dizziness. He reports dizziness still present but improved.    DM - A1c 8.6%. BS at home as high as 180s. No low BS reactions. He takes actos, farxiga, and metformin. No numbness/tingling. He plans to see eye provider soon. He takes ACEI and statin  Hyperlipidemia - uncontrolled but improving on crestor. Off lovaza. TG down to 484 (prev 530); LDL not calculated due to elevated TG; HDL 45.   Gout - controlled. Uric acid 6.4. Insurance will not cover uloric and he has been taking allopurinol instead he also takes colchicine.  Rash/atopic dermatitis - stable on eucrisa and prn hydrocortisone. Followed by dermatology  HTN - suboptimally controlled on lisinopril and amlodipine. He is on an ASA daily.     Past Medical History:  Diagnosis Date  . Arthritis   . Diabetes mellitus ([place])    type II ; diagnosed 2006  . Gout   . Hyperlipidemia   . Hypertension    diagnosed 2006  . Inguinal hernia     Past Surgical History:  Procedure Laterality Date  . INGUINAL HERNIA REPAIR Right 04/01/2014   Procedure: LAPAROSCOPIC RIGHT  INGUINAL HERNIA REPAIR WITH MESH;  Surgeon: Ralene Ok, MD;  Location: [facility];  Service: General;  Laterality: Right;     reports that he quit smoking about 8 years ago. His smoking use included cigarettes. He has a 40.00 pack-year smoking history. he has never used smokeless tobacco. He reports that he drinks about 1.8 oz of alcohol per week. He reports that he does not use drugs. Social History   Socioeconomic History  . Marital status: Legally Separated    Spouse name: Not on file  . Number of children: Not on file  . Years of education: Not on file  . Highest education level: Not on file  Social Needs  . Financial resource strain: Not on file  . Food insecurity - worry: Not on file  . Food insecurity - inability: Not on file  . Transportation needs - medical: Not on file  . Transportation needs - non-medical: Not on file  Occupational History  . Not on file  Tobacco Use  . Smoking status: Former Smoker    Packs/day: 1.00    Years: 40.00    Pack years: 40.00    Types: Cigarettes    Last attempt to quit: 01/04/2009  Years since quitting: 8.6  . Smokeless tobacco: Never Used  Substance and Sexual Activity  . Alcohol use: Yes    Alcohol/week: 1.8 oz    Types: 3 Shots of liquor per week  . Drug use: No    Comment: Used back in the 70's   . Sexual activity: Not Currently  Other Topics Concern  . Not on file  Social History Narrative   Social History      Diet? 40      Do you drink/eat things with caffeine? coffee      Marital status?                  separated                  What year were you married? 1997      Do you live in a house, apartment, assisted living, condo, trailer, etc.? House, apartment, trailer      Is it one or more stories? no      How many persons live in your home? 2        Do you have any pets in your home? (please list) no      Highest level of education completed? 12      Current or past profession: sanitation, [employer]      Do you exercise?                 yes                     Type & how often? Twice a week-sit ups      Advanced Directives      Do you have a living will? no      Do you have a DNR form?                                  If not, do you want to discuss one? no      Do you have signed POA/HPOA for forms? no      Functional Status      Do you have difficulty bathing or dressing yourself? no      Do you have difficulty preparing food or eating? no      Do you have difficulty managing your medications? no      Do you have difficulty managing your finances? no      Do you have difficulty affording your medications? no    Family History  Problem Relation Age of Onset  . Diabetes Mother   . Hypertension Mother   . Lung cancer Mother   . Diabetes Sister   . Hypertension Sister   . Heart disease Father   . Diabetes Father   . Hypertension Father   . Colon cancer Maternal Grandfather     Allergies  Allergen Reactions  . Penicillins Itching    Outpatient Encounter Medications as of 08/12/2017  Medication Sig  . ACCU-CHEK FASTCLIX LANCETS MISC 1 each by Other route daily. Dx: E11.65  . allopurinol (ZYLOPRIM) 100 MG tablet Take 1 tablet (100 mg total) by mouth daily.  Marland Kitchen amLODipine (NORVASC) 10 MG tablet Take 1 tablet (10 mg total) by mouth every evening.  Marland Kitchen aspirin EC 81 MG tablet Take 1 tablet (81 mg total) by mouth daily.  Marland Kitchen atenolol (TENORMIN) 50 MG tablet Take 1 tablet (50 mg total) by mouth  every morning.  . Blood Glucose Monitoring Suppl (ACCU-CHEK GUIDE) w/Device KIT 1 each by Other route daily. Dx: E11.65  . colchicine 0.6 MG tablet Take 0.6 mg by mouth. Take two tablets, Then one tablet every hour for flare ups.  Stasia Cavalier (EUCRISA) 2 % OINT Apply 1 application topically 2 (two) times daily as needed.  .  dapagliflozin propanediol (FARXIGA) 5 MG TABS tablet Take 5 mg by mouth daily.  . febuxostat (ULORIC) 40 MG tablet Take 1 tablet (40 mg total) by mouth daily.  Marland Kitchen glucose blood (ACCU-CHEK GUIDE) test strip 1 each by Other route daily. Dx: E11.65  . hydrocortisone cream 1 % Apply 1 application topically once as needed for itching.  . Lancets Misc. (ACCU-CHEK FASTCLIX LANCET) KIT 1 each by Other route daily. Dx:E11.65  . lisinopril (PRINIVIL,ZESTRIL) 40 MG tablet Take 1 tablet (40 mg total) by mouth every morning.  . metFORMIN (GLUCOPHAGE) 500 MG tablet Take 2 tablets (1,000 mg total) by mouth 2 (two) times daily with a meal.  . naproxen sodium (ALEVE) 220 MG tablet Take 220 mg daily as needed by mouth.  . pioglitazone (ACTOS) 30 MG tablet Take 1 tablet (30 mg total) by mouth daily.  . rosuvastatin (CRESTOR) 10 MG tablet Take 1 tablet (10 mg total) by mouth daily.  . Tetrahydrozoline HCl (VISINE EXTRA OP) Place 2 drops into both eyes once as needed (dry eyes.).  Marland Kitchen omega-3 acid ethyl esters (LOVAZA) 1 g capsule Take 2 capsules (2 g total) by mouth 2 (two) times daily. (Patient not taking: Reported on 08/12/2017)   No facility-administered encounter medications on file as of 08/12/2017.     Review of Systems:  Review of Systems  Musculoskeletal: Positive for joint swelling.  Neurological: Positive for dizziness.  All other systems reviewed and are negative.   Health Maintenance  Topic Date Due  . FOOT EXAM  02/04/1962  . INFLUENZA VACCINE  10/02/2017 (Originally 01/12/2017)  . TETANUS/TDAP  05/09/2018 (Originally 02/05/1971)  . Hepatitis C Screening  05/09/2018 (Originally 1951/01/21)  . HIV Screening  05/09/2018 (Originally 02/05/1967)  . PNA vac Low Risk Adult (1 of 2 - PCV13) 05/09/2018 (Originally 02/04/2017)  . OPHTHALMOLOGY EXAM  08/13/2018 (Originally 02/04/1962)  . COLONOSCOPY  08/13/2018 (Originally 01/20/2017)  . HEMOGLOBIN A1C  02/06/2018    Physical Exam: Vitals:   08/12/17 1210  BP:  (!) 162/80  Pulse: 92  Temp: 98 F (36.7 C)  TempSrc: Oral  SpO2: 96%  Weight: 233 lb (105.7 kg)  Height: 5' 10" (1.778 m)   Body mass index is 33.43 kg/m. Physical Exam  Constitutional: He is oriented to person, place, and time. He appears well-developed and well-nourished.  HENT:  Mouth/Throat: Oropharynx is clear and moist.  MMM; no oral thrush  Eyes: Pupils are equal, round, and reactive to light. No scleral icterus.  Neck: Neck supple. Carotid bruit is not present. No thyromegaly present.  Cardiovascular: Normal rate, regular rhythm and intact distal pulses. Exam reveals no gallop and no friction rub.  Murmur (1/6 SEM) heard. No distal LE edema. No calf TTP  Pulmonary/Chest: Effort normal and breath sounds normal. He has no wheezes. He has no rales. He exhibits no tenderness.  Abdominal: Soft. Normal appearance and bowel sounds are normal. He exhibits no distension, no abdominal bruit, no pulsatile midline mass and no mass. There is no hepatomegaly. There is no tenderness. There is no rigidity, no rebound and no guarding. No hernia.  obese  Musculoskeletal: He exhibits edema.  Lymphadenopathy:    He has no cervical adenopathy.  Neurological: He is alert and oriented to person, place, and time.  Skin: Skin is warm and dry. Rash (eczematous) noted.  Psychiatric: He has a normal mood and affect. His behavior is normal. Judgment and thought content normal.    Labs reviewed: Basic Metabolic Panel: Recent Labs    05/04/17 0939 06/10/17 0839 08/09/17 0828  NA 136 140 140  K 3.7 3.9 3.5  CL 99 102 101  CO2 _0 GLUCOSE 249* 158* 219*  BUN _1 CREATININE 1.09 1.15 1.00  CALCIUM 9.9 9.7 9.7   Liver Function Tests: Recent Labs    05/04/17 0939 08/09/17 0828  AST 21 18  ALT 21 18  BILITOT 0.5 0.7  PROT 7.3 6.9   No results for input(s): LIPASE, AMYLASE in the last 8760 hours. No results for input(s): AMMONIA in the last 8760 hours. CBC: Recent Labs     05/04/17 0939  WBC 8.2  NEUTROABS 5,092  HGB 16.2  HCT 46.3  MCV 91.5  PLT 218   Lipid Panel: Recent Labs    05/04/17 0939 06/10/17 0839 08/09/17 0828  CHOL 325* 138 146  HDL 33* 44 45  TRIG 1,686* 530* 484*  CHOLHDL 9.8* 3.1 3.2   Lab Results  Component Value Date   HGBA1C 8.6 (H) 08/09/2017    Procedures since last visit: No results found.  Assessment/Plan     ICD-10-CM   1. Essential hypertension I10 amLODipine (NORVASC) 10 MG tablet    BMP with eGFR([facility])    Urinalysis with Reflex Microscopic  2. Type 2 diabetes mellitus with hyperglycemia, without long-term current use of insulin (HCC) E11.65 BMP with eGFR([facility])    ALT    Hemoglobin A1c    Urinalysis with Reflex Microscopic    Microalbumin/Creatinine Ratio, Urine  3. Hyperlipidemia associated with type 2 diabetes mellitus (HCC) E11.69 rosuvastatin (CRESTOR) 10 MG tablet   E78.5 Lipid Panel  4. Chronic gout without tophus, unspecified cause, unspecified site [id number]   5. Atopic dermatitis, unspecified type L20.9      Reduce salt in diet to no added salt - he declined adjustment of meds  Watch fatty foods  RESUME LOVAZA - TAKE 1 TAB DAILY. IF DIZZINESS RETURNS, PLEASE CALL OFFICE   Avoid foods high in purines (seafood, red meat, etc)  Check blood sugar 2 times daily and record. Bring diary to next appt  MAKE APPT WITH CATHEY MILLER FOR DIABETIC TEACHING  Continue other medications as ordered  Follow up with dermatology for rash and itching  If itching does not improve, please call office as you may need to see an allergist for skin patch testing  Follow up with Janett Billow in June as scheduled. Fasting labs prior to appt  [place] S. Perlie Gold  [facility] [address] [place], [place] [address] [phone] Cell (Monday-Friday 8 AM - 5 PM) [phone] After 5 PM and follow prompts

## 2017-08-12 NOTE — Patient Instructions (Addendum)
Reduce salt in diet to no added salt  Watch fatty foods  RESUME LOVAZA - TAKE 1 TAB DAILY. IF DIZZINESS RETURNS, PLEASE CALL OFFICE   Avoid foods high in purines (seafood, red meat, etc)  Check blood sugar 2 times daily and record. Bring diary to next appt  MAKE APPT WITH CATHEY MILLER FOR DIABETIC TEACHING  Continue other medications as ordered  Follow up with dermatology for rash and itching  If itching does not improve, please call office as you may need to see an allergist for skin patch testing  Follow up with Janett Billow in June as scheduled. Fasting labs prior to appt

## 2017-09-20 ENCOUNTER — Other Ambulatory Visit: Payer: Self-pay | Admitting: Nurse Practitioner

## 2017-09-20 DIAGNOSIS — I10 Essential (primary) hypertension: Secondary | ICD-10-CM

## 2017-09-23 ENCOUNTER — Telehealth: Payer: Self-pay

## 2017-09-23 MED ORDER — METFORMIN HCL ER 500 MG PO TB24: 1000.0000 mg | ORAL_TABLET | Freq: Two times a day (BID) | ORAL | 1 refills | Status: DC

## 2017-09-23 NOTE — Telephone Encounter (Signed)
A fax was received from [place] requesting that metformin 500 mg IR be changed to metformin 500 mg XR.   Medication was changed to Metformin 500 mg XR. Rx was sent to pharmacy for #90 with 1 RF.

## 2017-11-08 ENCOUNTER — Other Ambulatory Visit: Payer: Self-pay | Admitting: *Deleted

## 2017-11-08 MED ORDER — METFORMIN HCL ER 500 MG PO TB24: 1000.0000 mg | ORAL_TABLET | Freq: Two times a day (BID) | ORAL | 1 refills | Status: DC

## 2017-11-08 NOTE — Telephone Encounter (Signed)
Curtis Allison 

## 2017-11-25 ENCOUNTER — Encounter: Payer: Self-pay | Admitting: Nurse Practitioner

## 2017-11-25 ENCOUNTER — Ambulatory Visit (INDEPENDENT_AMBULATORY_CARE_PROVIDER_SITE_OTHER): Payer: Medicare Other | Admitting: Nurse Practitioner

## 2017-11-25 VITALS — BP 172/88 | HR 92 | Temp 98.3°F | Ht 70.0 in | Wt 229.0 lb

## 2017-11-25 DIAGNOSIS — E1165 Type 2 diabetes mellitus with hyperglycemia: Secondary | ICD-10-CM | POA: Diagnosis not present

## 2017-11-25 DIAGNOSIS — I10 Essential (primary) hypertension: Secondary | ICD-10-CM | POA: Diagnosis not present

## 2017-11-25 DIAGNOSIS — E785 Hyperlipidemia, unspecified: Secondary | ICD-10-CM

## 2017-11-25 DIAGNOSIS — E1169 Type 2 diabetes mellitus with other specified complication: Secondary | ICD-10-CM | POA: Diagnosis not present

## 2017-11-25 DIAGNOSIS — M1A9XX0 Chronic gout, unspecified, without tophus (tophi): Secondary | ICD-10-CM

## 2017-11-25 MED ORDER — EMPAGLIFLOZIN 25 MG PO TABS: 25.0000 mg | ORAL_TABLET | Freq: Every day | ORAL | 3 refills | Status: DC

## 2017-11-25 MED ORDER — OMEGA-3-ACID ETHYL ESTERS 1 G PO CAPS: 2.0000 g | ORAL_CAPSULE | Freq: Two times a day (BID) | ORAL | 2 refills | Status: DC

## 2017-11-25 NOTE — Patient Instructions (Addendum)
Continue to stay active 30 mins/5 days a week   To set alarms/pill packs to remember medications  To start Jardiance 25 mg by mouth daily- if you can not afford this to CALL us and we will work with you to find a medication.   Make lab appt for Tuesday 6/18 for fasting labs     Food Choices to Lower Your Triglycerides Triglycerides are a type of fat in your blood. High levels of triglycerides can increase the risk of heart disease and stroke. If your triglyceride levels are high, the foods you eat and your eating habits are very important. Choosing the right foods can help lower your triglycerides. What general guidelines do I need to follow?  Lose weight if you are overweight.  Limit or avoid alcohol.  Fill one half of your plate with vegetables and green salads.  Limit fruit to two servings a day. Choose fruit instead of juice.  Make one fourth of your plate whole grains. Look for the word "whole" as the first word in the ingredient list.  Fill one fourth of your plate with lean protein foods.  Enjoy fatty fish (such as salmon, mackerel, sardines, and tuna) three times a week.  Choose healthy fats.  Limit foods high in starch and sugar.  Eat more home-cooked food and less restaurant, buffet, and fast food.  Limit fried foods.  Cook foods using methods other than frying.  Limit saturated fats.  Check ingredient lists to avoid foods with partially hydrogenated oils (trans fats) in them. What foods can I eat? Grains Whole grains, such as whole wheat or whole grain breads, crackers, cereals, and pasta. Unsweetened oatmeal, bulgur, barley, quinoa, or brown rice. Corn or whole wheat flour tortillas. Vegetables Fresh or frozen vegetables (raw, steamed, roasted, or grilled). Green salads. Fruits All fresh, canned (in natural juice), or frozen fruits. Meat and Other Protein Products Ground beef (85% or leaner), grass-fed beef, or beef trimmed of fat. Skinless chicken or  [place]. Ground chicken or [place]. Pork trimmed of fat. All fish and seafood. Eggs. Dried beans, peas, or lentils. Unsalted nuts or seeds. Unsalted canned or dry beans. Dairy Low-fat dairy products, such as skim or 1% milk, 2% or reduced-fat cheeses, low-fat ricotta or cottage cheese, or plain low-fat yogurt. Fats and Oils Tub margarines without trans fats. Light or reduced-fat mayonnaise and salad dressings. Avocado. Safflower, olive, or canola oils. Natural peanut or almond butter. The items listed above may not be a complete list of recommended foods or beverages. Contact your dietitian for more options. What foods are not recommended? Grains White bread. White pasta. White rice. Cornbread. Bagels, pastries, and croissants. Crackers that contain trans fat. Vegetables White potatoes. Corn. Creamed or fried vegetables. Vegetables in a cheese sauce. Fruits Dried fruits. Canned fruit in light or heavy syrup. Fruit juice. Meat and Other Protein Products Fatty cuts of meat. Ribs, chicken wings, bacon, sausage, bologna, salami, chitterlings, fatback, hot dogs, bratwurst, and packaged luncheon meats. Dairy Whole or 2% milk, cream, half-and-half, and cream cheese. Whole-fat or sweetened yogurt. Full-fat cheeses. Nondairy creamers and whipped toppings. Processed cheese, cheese spreads, or cheese curds. Sweets and Desserts Corn syrup, sugars, honey, and molasses. Candy. Jam and jelly. Syrup. Sweetened cereals. Cookies, pies, cakes, donuts, muffins, and ice cream. Fats and Oils Butter, stick margarine, lard, shortening, ghee, or bacon fat. Coconut, palm kernel, or palm oils. Beverages Alcohol. Sweetened drinks (such as sodas, lemonade, and fruit drinks or punches). The items listed above may not be  a complete list of foods and beverages to avoid. Contact your dietitian for more information. This information is not intended to replace advice given to you by your health care provider. Make sure you  discuss any questions you have with your health care provider. Document Released: 03/18/2004 Document Revised: 11/06/2015 Document Reviewed: 04/04/2013 Elsevier Interactive Patient Education  2017 [name].

## 2017-11-25 NOTE — Progress Notes (Signed)
Careteam: Patient Care Team: Lauree Chandler, NP as PCP - General (Geriatric Medicine) Crista Luria, MD as Attending Physician (Dermatology)  Advanced Directive information Does Patient Have a Medical Advance Directive?: No  Allergies  Allergen Reactions  . Penicillins Itching    Chief Complaint  Patient presents with  . Medical Management of Chronic Issues    Pt is being seen for a 5 month routine visit. Pt has no concerns today.      HPI: Patient is a 67 y.o. male seen in the office today for routine follow up.   DM- was supposed to follow up with Tivis Ringer but never scheduled appt. Not taking farxiga (can not afford) Reports blood sugars 150-180.  Taking metformin 1000 mg twice daily, not taking it all the time. Reports he "forgets" sometimes.  Taking actos. Pt reports he is aware of the consequences of not controlling his diabetes. Aware of risk of uncontrol blood sugar. States he is aware of risk of heart attack, stroke. Limb amputation, ESRD.  States he is taking an old prescription of jardance.   Hyperlipidemia- not fasting today. Not taking lovaza "cost too much" reports he is taking crestor 10 mg daily. Reports he tries to follow low fat diet, but then reports eating chips.   HTN- reports he has not taken medication today. States it is not this high at home generally in the 140s./80s at home. Taking amlodipine and lisinopril and atenolol  Eats a lot of chips. Foods high in sodium.   Gout- using allopurinol 100 mg daily, has had occasional flare, last was over a month ago.  Using colchicine PRN.   Review of Systems:  Review of Systems  Constitutional: Negative for chills, fever and weight loss.  HENT: Negative for tinnitus.   Respiratory: Negative for cough, sputum production and shortness of breath.   Cardiovascular: Negative for chest pain, palpitations and leg swelling.  Gastrointestinal: Negative for abdominal pain, constipation, diarrhea and  heartburn.  Genitourinary: Negative for dysuria, frequency and urgency.  Musculoskeletal: Negative for back pain, falls, joint pain and myalgias.  Skin: Negative.   Neurological: Negative for dizziness and headaches.  Psychiatric/Behavioral: Negative for depression and memory loss. The patient does not have insomnia.     Past Medical History:  Diagnosis Date  . Arthritis   . Diabetes mellitus ([place])    type II ; diagnosed 2006  . Gout   . Hyperlipidemia   . Hypertension    diagnosed 2006  . Inguinal hernia    Past Surgical History:  Procedure Laterality Date  . INGUINAL HERNIA REPAIR Right 04/01/2014   Procedure: LAPAROSCOPIC RIGHT  INGUINAL HERNIA REPAIR WITH MESH;  Surgeon: Ralene Ok, MD;  Location: [facility];  Service: General;  Laterality: Right;   Social History:   reports that he quit smoking about 8 years ago. His smoking use included cigarettes. He has a 40.00 pack-year smoking history. He has never used smokeless tobacco. He reports that he drinks about 1.8 oz of alcohol per week. He reports that he does not use drugs.  Family History  Problem Relation Age of Onset  . Diabetes Mother   . Hypertension Mother   . Lung cancer Mother   . Diabetes Sister   . Hypertension Sister   . Heart disease Father   . Diabetes Father   . Hypertension Father   . Colon cancer Maternal Grandfather     Medications: Patient's Medications  New Prescriptions   No medications on file  Previous Medications   ACCU-CHEK FASTCLIX LANCETS MISC    1 each by Other route daily. Dx: E11.65   ALLOPURINOL (ZYLOPRIM) 100 MG TABLET    Take 1 tablet (100 mg total) by mouth daily.   AMLODIPINE (NORVASC) 10 MG TABLET    Take 1 tablet (10 mg total) by mouth every evening.   ASPIRIN EC 81 MG TABLET    Take 1 tablet (81 mg total) by mouth daily.   ATENOLOL (TENORMIN) 50 MG TABLET    Take 1 tablet (50 mg total) by mouth every morning.   BLOOD GLUCOSE MONITORING SUPPL (ACCU-CHEK GUIDE) W/DEVICE KIT     1 each by Other route daily. Dx: E11.65   COLCHICINE 0.6 MG TABLET    Take 0.6 mg by mouth. Take two tablets, Then one tablet every hour for flare ups.   GLUCOSE BLOOD (ACCU-CHEK GUIDE) TEST STRIP    1 each by Other route daily. Dx: E11.65   HYDROCORTISONE CREAM 1 %    Apply 1 application topically once as needed for itching.   LANCETS MISC. (ACCU-CHEK FASTCLIX LANCET) KIT    1 each by Other route daily. Dx:E11.65   LISINOPRIL (PRINIVIL,ZESTRIL) 40 MG TABLET    TAKE 1 TABLET BY MOUTH EVERY MORNING   METFORMIN (GLUCOPHAGE-XR) 500 MG 24 HR TABLET    Take 2 tablets (1,000 mg total) by mouth 2 (two) times daily.   NAPROXEN SODIUM (ALEVE) 220 MG TABLET    Take 220 mg daily as needed by mouth.   PIOGLITAZONE (ACTOS) 30 MG TABLET    Take 1 tablet (30 mg total) by mouth daily.   ROSUVASTATIN (CRESTOR) 10 MG TABLET    Take 1 tablet (10 mg total) by mouth daily.   TETRAHYDROZOLINE HCL (VISINE EXTRA OP)    Place 2 drops into both eyes once as needed (dry eyes.).  Modified Medications   No medications on file  Discontinued Medications   CRISABOROLE (EUCRISA) 2 % OINT    Apply 1 application topically 2 (two) times daily as needed.   DAPAGLIFLOZIN PROPANEDIOL (FARXIGA) 5 MG TABS TABLET    Take 5 mg by mouth daily.   OMEGA-3 ACID ETHYL ESTERS (LOVAZA) 1 G CAPSULE    Take 2 capsules (2 g total) by mouth 2 (two) times daily.     Physical Exam:  Vitals:   11/25/17 1125  BP: (!) 172/88  Pulse: 92  Temp: 98.3 F (36.8 C)  TempSrc: Oral  SpO2: 96%  Weight: 229 lb (103.9 kg)  Height: _0  (1.778 m)   Body mass index is 32.86 kg/m.  Physical Exam  Constitutional: He is oriented to person, place, and time. He appears well-developed and well-nourished. No distress.  HENT:  Head: Normocephalic and atraumatic.  Mouth/Throat: Oropharynx is clear and moist. No oropharyngeal exudate.  Eyes: Pupils are equal, round, and reactive to light. Conjunctivae and EOM are normal.  Neck: Normal range of motion.  Neck supple.  Cardiovascular: Normal rate, regular rhythm and normal heart sounds.  Pulmonary/Chest: Effort normal and breath sounds normal.  Abdominal: Soft. Bowel sounds are normal.  Musculoskeletal: He exhibits no edema or tenderness.  Neurological: He is alert and oriented to person, place, and time. No cranial nerve deficit or sensory deficit.  Skin: Skin is warm and dry. He is not diaphoretic.  Psychiatric: He has a normal mood and affect.    Labs reviewed: Basic Metabolic Panel: Recent Labs    05/04/17 0939 06/10/17 0839 08/09/17 0828  NA 136 140  140  K 3.7 3.9 3.5  CL 99 102 101  CO2 _0 GLUCOSE 249* 158* 219*  BUN _1 CREATININE 1.09 1.15 1.00  CALCIUM 9.9 9.7 9.7   Liver Function Tests: Recent Labs    05/04/17 0939 08/09/17 0828  AST 21 18  ALT 21 18  BILITOT 0.5 0.7  PROT 7.3 6.9   No results for input(s): LIPASE, AMYLASE in the last 8760 hours. No results for input(s): AMMONIA in the last 8760 hours. CBC: Recent Labs    05/04/17 0939  WBC 8.2  NEUTROABS 5,092  HGB 16.2  HCT 46.3  MCV 91.5  PLT 218   Lipid Panel: Recent Labs    05/04/17 0939 06/10/17 0839 08/09/17 0828  CHOL 325* 138 146  HDL 33* 44 45  TRIG 1,686* 530* 484*  CHOLHDL 9.8* 3.1 3.2   TSH: No results for input(s): TSH in the last 8760 hours. A1C: Lab Results  Component Value Date   HGBA1C 8.6 (H) 08/09/2017     Assessment/Plan 1. Essential hypertension Elevated today however did not take blood pressure medications, states it is 140 or less at home, discuss dietary modifications and medication compliance. Plans to take medication when he returns home.   2. Type 2 diabetes mellitus with hyperglycemia, without long-term current use of insulin (HCC) Not controlled, to continue metformin and actos, could not afford farxiga  - empagliflozin (JARDIANCE) 25 MG TABS tablet; Take 25 mg by mouth daily.  Dispense: 30 tablet; Refill: 3  3. Hyperlipidemia associated  with type 2 diabetes mellitus ([place]) Has stopped taking lovaza. Encouraged to restart at this time. Continues on crestor 10 mg daily. Diet information given for high triglyceride diet. Also encouraged to better control diabetes to help with triglycerides.  4. Chronic gout without tophus, unspecified cause, unspecified site -no recent flare, continues on allopurinol and colchicine PRN flare - Uric acid; Future  Next appt: fasting labs next week, 6 week follow up with Dr Thayer Headings [facility]. [place], [facility] [phone]

## 2017-11-29 ENCOUNTER — Other Ambulatory Visit: Payer: Medicare Other

## 2017-11-29 ENCOUNTER — Other Ambulatory Visit: Payer: Self-pay

## 2017-11-29 DIAGNOSIS — E1165 Type 2 diabetes mellitus with hyperglycemia: Secondary | ICD-10-CM

## 2017-11-29 DIAGNOSIS — E1169 Type 2 diabetes mellitus with other specified complication: Secondary | ICD-10-CM

## 2017-11-29 DIAGNOSIS — M1A9XX0 Chronic gout, unspecified, without tophus (tophi): Secondary | ICD-10-CM

## 2017-11-29 DIAGNOSIS — E785 Hyperlipidemia, unspecified: Secondary | ICD-10-CM

## 2017-11-29 DIAGNOSIS — I10 Essential (primary) hypertension: Secondary | ICD-10-CM

## 2017-11-30 LAB — URINALYSIS, ROUTINE W REFLEX MICROSCOPIC
BILIRUBIN URINE: NEGATIVE
Hgb urine dipstick: NEGATIVE
Ketones, ur: NEGATIVE
LEUKOCYTES UA: NEGATIVE
Nitrite: NEGATIVE
PH: 6 (ref 5.0–8.0)
Protein, ur: NEGATIVE
Specific Gravity, Urine: 1.034 (ref 1.001–1.03)

## 2017-11-30 LAB — LIPID PANEL
CHOLESTEROL: 152 mg/dL (ref <200)
HDL: 40 mg/dL — AB (ref >40)
Non-HDL Cholesterol (Calc): 112 mg/dL (calc) (ref <130)
TRIGLYCERIDES: 509 mg/dL — AB (ref <150)
Total CHOL/HDL Ratio: 3.8 (calc) (ref <5.0)

## 2017-11-30 LAB — BASIC METABOLIC PANEL WITH GFR
BUN: 10 mg/dL (ref 7–25)
CALCIUM: 9.6 mg/dL (ref 8.6–10.3)
CO2: 26 mmol/L (ref 20–32)
CREATININE: 0.99 mg/dL (ref 0.70–1.25)
Chloride: 103 mmol/L (ref 98–110)
GFR, EST AFRICAN AMERICAN: 92 mL/min/{1.73_m2} (ref >=60)
GFR, EST NON AFRICAN AMERICAN: 80 mL/min/{1.73_m2} (ref >=60)
Glucose, Bld: 142 mg/dL — ABNORMAL HIGH (ref 65–99)
Potassium: 3.6 mmol/L (ref 3.5–5.3)
Sodium: 141 mmol/L (ref 135–146)

## 2017-11-30 LAB — HEMOGLOBIN A1C
EAG (MMOL/L): 10.3 (calc)
Hgb A1c MFr Bld: 8.1 % of total Hgb — ABNORMAL HIGH (ref <5.7)
Mean Plasma Glucose: 186 (calc)

## 2017-11-30 LAB — URIC ACID: Uric Acid, Serum: 7.3 mg/dL (ref 4.0–8.0)

## 2017-11-30 LAB — MICROALBUMIN / CREATININE URINE RATIO
Creatinine, Urine: 107 mg/dL (ref 20–320)
MICROALB UR: 2.8 mg/dL
MICROALB/CREAT RATIO: 26 ug/mg{creat} (ref <30)

## 2017-11-30 LAB — ALT: ALT: 17 U/L (ref 9–46)

## 2017-12-01 ENCOUNTER — Telehealth: Payer: Self-pay

## 2017-12-01 MED ORDER — ALLOPURINOL 100 MG PO TABS: 200.0000 mg | ORAL_TABLET | Freq: Every day | ORAL | 5 refills | Status: DC

## 2017-12-01 NOTE — Telephone Encounter (Signed)
-----   Message from Lauree Chandler, NP sent at 12/01/2017 12:09 PM EDT ----- Uric acid is above goal for people who have gout, would recommended increasing allopurinol to 200 mg by mouth daily at this time to get uric acid at goal and to prevent future flares.

## 2017-12-01 NOTE — Telephone Encounter (Signed)
Rx sent pharmacy  

## 2017-12-01 NOTE — Telephone Encounter (Signed)
Called and discussed results with the patient. Patient was hesitant but agreed to increase dose and keep [place] updated.

## 2017-12-17 ENCOUNTER — Other Ambulatory Visit: Payer: Self-pay | Admitting: Nurse Practitioner

## 2017-12-17 DIAGNOSIS — I10 Essential (primary) hypertension: Secondary | ICD-10-CM

## 2018-01-16 ENCOUNTER — Other Ambulatory Visit: Payer: Self-pay | Admitting: Nurse Practitioner

## 2018-01-16 DIAGNOSIS — E118 Type 2 diabetes mellitus with unspecified complications: Secondary | ICD-10-CM

## 2018-01-18 ENCOUNTER — Ambulatory Visit (INDEPENDENT_AMBULATORY_CARE_PROVIDER_SITE_OTHER): Payer: Medicare Other | Admitting: Internal Medicine

## 2018-01-18 ENCOUNTER — Encounter: Payer: Self-pay | Admitting: Internal Medicine

## 2018-01-18 ENCOUNTER — Ambulatory Visit (INDEPENDENT_AMBULATORY_CARE_PROVIDER_SITE_OTHER): Payer: Medicare Other

## 2018-01-18 VITALS — BP 182/94 | HR 88 | Temp 98.2°F | Ht 70.0 in | Wt 229.0 lb

## 2018-01-18 DIAGNOSIS — E1169 Type 2 diabetes mellitus with other specified complication: Secondary | ICD-10-CM | POA: Diagnosis not present

## 2018-01-18 DIAGNOSIS — F1010 Alcohol abuse, uncomplicated: Secondary | ICD-10-CM

## 2018-01-18 DIAGNOSIS — E1165 Type 2 diabetes mellitus with hyperglycemia: Secondary | ICD-10-CM | POA: Diagnosis not present

## 2018-01-18 DIAGNOSIS — Z135 Encounter for screening for eye and ear disorders: Secondary | ICD-10-CM | POA: Diagnosis not present

## 2018-01-18 DIAGNOSIS — E785 Hyperlipidemia, unspecified: Secondary | ICD-10-CM

## 2018-01-18 DIAGNOSIS — Z0000 Encounter for general adult medical examination without abnormal findings: Secondary | ICD-10-CM | POA: Diagnosis not present

## 2018-01-18 DIAGNOSIS — I10 Essential (primary) hypertension: Secondary | ICD-10-CM

## 2018-01-18 DIAGNOSIS — G4700 Insomnia, unspecified: Secondary | ICD-10-CM

## 2018-01-18 DIAGNOSIS — E1159 Type 2 diabetes mellitus with other circulatory complications: Secondary | ICD-10-CM | POA: Diagnosis not present

## 2018-01-18 NOTE — Patient Instructions (Addendum)
TAKE ALL MEDICATIONS AS ORDERED  START OTC MELATONIN 3-6MG  AT BEDTIME TO HELP SLEEP  STOP ALCOHOL USE especially with the medications that you take. You are at increased risk of liver disease and other complications  Increase exercise as tolerated  Follow up in 3 mos with Janett Billow for DM, HTN, Etoh use and hyperlipidemia. Fasting labs prior to appt

## 2018-01-18 NOTE — Progress Notes (Signed)
Subjective:   Perl Kerney is a 67 y.o. male who presents for an Initial Medicare Annual Wellness Visit      Objective:    Today's Vitals   01/18/18 1125  BP: (!) 182/94  Pulse: 88  Temp: 98.2 F (36.8 C)  TempSrc: Oral  SpO2: 97%  Weight: 229 lb (103.9 kg)  Height: _0  (1.778 m)  Provider notified of BP  Body mass index is 32.86 kg/m.  Advanced Directives 01/18/2018 11/25/2017 08/12/2017 06/15/2017 05/09/2017 05/04/2017 03/26/2014  Does Patient Have a Medical Advance Directive? _1  No No  Would patient like information on creating a medical advance directive? Yes (MAU/Ambulatory/Procedural Areas - Information given) - Yes (MAU/Ambulatory/Procedural Areas - Information given) Yes (MAU/Ambulatory/Procedural Areas - Information given) No - Patient declined Yes (MAU/Ambulatory/Procedural Areas - Information given) No - patient declined information    Current Medications (verified) Outpatient Encounter Medications as of 01/18/2018  Medication Sig  . ACCU-CHEK FASTCLIX LANCETS MISC 1 each by Other route daily. Dx: E11.65  . allopurinol (ZYLOPRIM) 100 MG tablet Take 2 tablets (200 mg total) by mouth daily.  Marland Kitchen amLODipine (NORVASC) 10 MG tablet Take 1 tablet (10 mg total) by mouth every evening.  Marland Kitchen aspirin EC 81 MG tablet Take 1 tablet (81 mg total) by mouth daily.  Marland Kitchen atenolol (TENORMIN) 50 MG tablet TAKE 1 TABLET BY MOUTH EVERY MORNING  . Blood Glucose Monitoring Suppl (ACCU-CHEK GUIDE) w/Device KIT 1 each by Other route daily. Dx: E11.65  . colchicine 0.6 MG tablet Take 0.6 mg by mouth. Take two tablets, Then one tablet every hour for flare ups.  Marland Kitchen glucose blood (ACCU-CHEK GUIDE) test strip 1 each by Other route daily. Dx: E11.65  . hydrocortisone cream 1 % Apply 1 application topically once as needed for itching.  . Lancets Misc. (ACCU-CHEK FASTCLIX LANCET) KIT 1 each by Other route daily. Dx:E11.65  . lisinopril (PRINIVIL,ZESTRIL) 40 MG tablet TAKE 1 TABLET BY MOUTH  EVERY MORNING  . metFORMIN (GLUCOPHAGE-XR) 500 MG 24 hr tablet Take 2 tablets (1,000 mg total) by mouth 2 (two) times daily.  . naproxen sodium (ALEVE) 220 MG tablet Take 220 mg daily as needed by mouth.  . pioglitazone (ACTOS) 30 MG tablet TAKE 1 TABLET BY MOUTH DAILY  . rosuvastatin (CRESTOR) 10 MG tablet Take 1 tablet (10 mg total) by mouth daily.  . Tetrahydrozoline HCl (VISINE EXTRA OP) Place 2 drops into both eyes once as needed (dry eyes.).  Marland Kitchen empagliflozin (JARDIANCE) 25 MG TABS tablet Take 25 mg by mouth daily. (Patient not taking: Reported on 01/18/2018)  . omega-3 acid ethyl esters (LOVAZA) 1 g capsule Take 2 capsules (2 g total) by mouth 2 (two) times daily. (Patient not taking: Reported on 01/18/2018)   No facility-administered encounter medications on file as of 01/18/2018.     Allergies (verified) Penicillins   History: Past Medical History:  Diagnosis Date  . Arthritis   . Diabetes mellitus ([place])    type II ; diagnosed 2006  . Gout   . Hyperlipidemia   . Hypertension    diagnosed 2006  . Inguinal hernia    Past Surgical History:  Procedure Laterality Date  . INGUINAL HERNIA REPAIR Right 04/01/2014   Procedure: LAPAROSCOPIC RIGHT  INGUINAL HERNIA REPAIR WITH MESH;  Surgeon: Ralene Ok, MD;  Location: [facility];  Service: General;  Laterality: Right;   Family History  Problem Relation Age of Onset  . Diabetes Mother   . Hypertension Mother   .  Lung cancer Mother   . Diabetes Sister   . Hypertension Sister   . Heart disease Father   . Diabetes Father   . Hypertension Father   . Colon cancer Maternal Grandfather    Social History   Socioeconomic History  . Marital status: Legally Separated    Spouse name: Not on file  . Number of children: Not on file  . Years of education: Not on file  . Highest education level: Not on file  Occupational History  . Not on file  Social Needs  . Financial resource strain: Not hard at all  . Food insecurity:    Worry:  Never true    Inability: Never true  . Transportation needs:    Medical: No    Non-medical: No  Tobacco Use  . Smoking status: Former Smoker    Packs/day: 1.00    Years: 40.00    Pack years: 40.00    Types: Cigarettes    Last attempt to quit: 01/04/2009    Years since quitting: 9.0  . Smokeless tobacco: Never Used  Substance and Sexual Activity  . Alcohol use: Yes    Alcohol/week: 1.8 oz    Types: 3 Shots of liquor per week  . Drug use: No    Types: Marijuana    Comment: Used back in the 70's   . Sexual activity: Not Currently  Lifestyle  . Physical activity:    Days per week: 0 days    Minutes per session: 0 min  . Stress: Only a little  Relationships  . Social connections:    Talks on phone: Three times a week    Gets together: Once a week    Attends religious service: More than 4 times per year    Active member of club or organization: No    Attends meetings of clubs or organizations: Never    Relationship status: Separated  Other Topics Concern  . Not on file  Social History Narrative   Social History      Diet? 40      Do you drink/eat things with caffeine? coffee      Marital status?                  separated                  What year were you married? 1997      Do you live in a house, apartment, assisted living, condo, trailer, etc.? House, apartment, trailer      Is it one or more stories? no      How many persons live in your home? 2      Do you have any pets in your home? (please list) no      Highest level of education completed? 12      Current or past profession: sanitation, [employer]      Do you exercise?                 yes                     Type & how often? Twice a week-sit ups      Advanced Directives      Do you have a living will? no      Do you have a DNR form?  If not, do you want to discuss one? no      Do you have signed POA/HPOA for forms? no      Functional Status      Do you have  difficulty bathing or dressing yourself? no      Do you have difficulty preparing food or eating? no      Do you have difficulty managing your medications? no      Do you have difficulty managing your finances? no      Do you have difficulty affording your medications? no   Tobacco Counseling Counseling given: Not Answered   Clinical Intake:  Pre-visit preparation completed: No  Pain : No/denies pain     Nutritional Risks: None Diabetes: Yes CBG done?: No Did pt. bring in CBG monitor from home?: No  How often do you need to have someone help you when you read instructions, pamphlets, or other written materials from your doctor or pharmacy?: 1 - Never What is the last grade level you completed in school?: 12th grade  Interpreter Needed?: No  Information entered by :: Tyson Dense, RN  Activities of Daily Living In your present state of health, do you have any difficulty performing the following activities: 01/18/2018  Hearing? N  Vision? N  Difficulty concentrating or making decisions? N  Walking or climbing stairs? N  Dressing or bathing? N  Doing errands, shopping? N  Preparing Food and eating ? N  Using the Toilet? N  In the past six months, have you accidently leaked urine? N  Do you have problems with loss of bowel control? N  Managing your Medications? N  Managing your Finances? N  Housekeeping or managing your Housekeeping? N  Some recent data might be hidden     Immunizations and Health Maintenance  There is no immunization history on file for this patient. Health Maintenance Due  Topic Date Due  . INFLUENZA VACCINE  01/12/2018    Patient Care Team: Lauree Chandler, NP as PCP - General (Geriatric Medicine) Crista Luria, MD as Attending Physician (Dermatology)  Indicate any recent Medical Services you may have received from other than [facility] providers in the past year (date may be approximate).    Assessment:   This is a routine wellness  examination for Rudy.  Hearing/Vision screen Hearing Screening Comments: Patient reports no issues with hearing Vision Screening Comments: Referral sent to [facility]  Dietary issues and exercise activities discussed: Current Exercise Habits: The patient does not participate in regular exercise at present, Exercise limited by: None identified  Goals    None     Depression Screen PHQ 2/9 Scores 01/18/2018 06/15/2017 05/04/2017  PHQ - 2 Score 0 0 0    Fall Risk Fall Risk  01/18/2018 11/25/2017 08/12/2017 06/15/2017 05/09/2017  Falls in the past year? _0     Is the patient's home free of loose throw rugs in walkways, pet beds, electrical cords, etc?   yes      Grab bars in the bathroom? no      Handrails on the stairs?   yes      Adequate lighting?   yes   Cognitive Function: within normal function MMSE - Mini Mental State Exam 06/15/2017  Orientation to time 4  Orientation to Place 5  Registration 3  Attention/ Calculation 5  Recall 2  Language- name 2 objects 2  Language- repeat 1  Language- follow 3 step command 3  Language- read & follow  direction 1  Write a sentence 1  Copy design 0  Total score 27        Screening Tests Health Maintenance  Topic Date Due  . INFLUENZA VACCINE  01/12/2018  . TETANUS/TDAP  05/09/2018 (Originally 02/05/1970)  . Hepatitis C Screening  05/09/2018 (Originally 02-24-1951)  . HIV Screening  05/09/2018 (Originally 02[birth year]05[birth year]1967)  . PNA vac Low Risk Adult (1 of 2 - PCV13) 05/09/2018 (Originally 02/04/2017)  . OPHTHALMOLOGY EXAM  08/13/2018 (Originally 02[birth year]04/1962)  . COLONOSCOPY  08/13/2018 (Originally 01/20/2017)  . HEMOGLOBIN A1C  05/31/2018  . FOOT EXAM  11/26/2018    Qualifies for Shingles Vaccine? Yes, educated and declined  Cancer Screenings: Lung: Low Dose CT Chest recommended if Age 22-80 years, 30 pack-year currently smoking OR have quit w/in 15years. Patient does qualify. Colorectal: due, patient stated he will make the  next appointment  Additional Screenings:  Hepatitis C Screening: declined TDAP due: declined Prevnar due: declined    Plan:    I have personally reviewed and addressed the Medicare Annual Wellness questionnaire and have noted the following in the patient's chart:  A. Medical and social history B. Use of alcohol, tobacco or illicit drugs  C. Current medications and supplements D. Functional ability and status E.  Nutritional status F.  Physical activity G. Advance directives H. List of other physicians I.  Hospitalizations, surgeries, and ER visits in previous 12 months J.  Center Line to include hearing, vision, cognitive, depression L. Referrals and appointments - none  In addition, I have reviewed and discussed with patient certain preventive protocols, quality metrics, and best practice recommendations. A written personalized care plan for preventive services as well as general preventive health recommendations were provided to patient.  See attached scanned questionnaire for additional information.   Signed,   Tyson Dense, RN Nurse Health Advisor  Patient Concerns: Not getting enough sleep. He is waking up early and only getting about 2 hours of sleep a night

## 2018-01-18 NOTE — Patient Instructions (Signed)
[name] , Thank you for taking time to come for your Medicare Wellness Visit. I appreciate your ongoing commitment to your health goals. Please review the following plan we discussed and let me know if I can assist you in the future.   Screening recommendations/referrals: Colonoscopy due, please schedule your next procedure Recommended yearly ophthalmology/optometry visit for glaucoma screening and checkup-referral sent Recommended yearly dental visit for hygiene and checkup  Vaccinations: Influenza vaccine due, declined Pneumococcal vaccine due, declined Tdap vaccine due, declined Shingles vaccine due, declined    Advanced directives: Advance directive discussed with you today. I have provided a copy for you to complete at home and have notarized. Once this is complete please bring a copy in to our office so we can scan it into your chart.  Conditions/risks identified: none  Next appointment: Tyson Dense, RN 01/22/2019 @ 12:45pm  Preventive Care 66 Years and Older, Male Preventive care refers to lifestyle choices and visits with your health care provider that can promote health and wellness. What does preventive care include?  A yearly physical exam. This is also called an annual well check.  Dental exams once or twice a year.  Routine eye exams. Ask your health care provider how often you should have your eyes checked.  Personal lifestyle choices, including:  Daily care of your teeth and gums.  Regular physical activity.  Eating a healthy diet.  Avoiding tobacco and drug use.  Limiting alcohol use.  Practicing safe sex.  Taking low doses of aspirin every day.  Taking vitamin and mineral supplements as recommended by your health care provider. What happens during an annual well check? The services and screenings done by your health care provider during your annual well check will depend on your age, overall health, lifestyle risk factors, and family history of  disease. Counseling  Your health care provider may ask you questions about your:  Alcohol use.  Tobacco use.  Drug use.  Emotional well-being.  Home and relationship well-being.  Sexual activity.  Eating habits.  History of falls.  Memory and ability to understand (cognition).  Work and work Statistician. Screening  You may have the following tests or measurements:  Height, weight, and BMI.  Blood pressure.  Lipid and cholesterol levels. These may be checked every 5 years, or more frequently if you are over 20 years old.  Skin check.  Lung cancer screening. You may have this screening every year starting at age 52 if you have a 30-pack-year history of smoking and currently smoke or have quit within the past 15 years.  Fecal occult blood test (FOBT) of the stool. You may have this test every year starting at age 46.  Flexible sigmoidoscopy or colonoscopy. You may have a sigmoidoscopy every 5 years or a colonoscopy every 10 years starting at age 68.  Prostate cancer screening. Recommendations will vary depending on your family history and other risks.  Hepatitis C blood test.  Hepatitis B blood test.  Sexually transmitted disease (STD) testing.  Diabetes screening. This is done by checking your blood sugar (glucose) after you have not eaten for a while (fasting). You may have this done every 1-3 years.  Abdominal aortic aneurysm (AAA) screening. You may need this if you are a current or former smoker.  Osteoporosis. You may be screened starting at age 67 if you are at high risk. Talk with your health care provider about your test results, treatment options, and if necessary, the need for more tests. Vaccines  Your health care provider may recommend certain vaccines, such as:  Influenza vaccine. This is recommended every year.  Tetanus, diphtheria, and acellular pertussis (Tdap, Td) vaccine. You may need a Td booster every 10 years.  Zoster vaccine. You may  need this after age 54.  Pneumococcal 13-valent conjugate (PCV13) vaccine. One dose is recommended after age 45.  Pneumococcal polysaccharide (PPSV23) vaccine. One dose is recommended after age 101. Talk to your health care provider about which screenings and vaccines you need and how often you need them. This information is not intended to replace advice given to you by your health care provider. Make sure you discuss any questions you have with your health care provider. Document Released: 06/27/2015 Document Revised: 02/18/2016 Document Reviewed: 04/01/2015 Elsevier Interactive Patient Education  2017 [place] Prevention in the Home Falls can cause injuries. They can happen to people of all ages. There are many things you can do to make your home safe and to help prevent falls. What can I do on the outside of my home?  Regularly fix the edges of walkways and driveways and fix any cracks.  Remove anything that might make you trip as you walk through a door, such as a raised step or threshold.  Trim any bushes or trees on the path to your home.  Use bright outdoor lighting.  Clear any walking paths of anything that might make someone trip, such as rocks or tools.  Regularly check to see if handrails are loose or broken. Make sure that both sides of any steps have handrails.  Any raised decks and porches should have guardrails on the edges.  Have any leaves, snow, or ice cleared regularly.  Use sand or salt on walking paths during winter.  Clean up any spills in your garage right away. This includes oil or grease spills. What can I do in the bathroom?  Use night lights.  Install grab bars by the toilet and in the tub and shower. Do not use towel bars as grab bars.  Use non-skid mats or decals in the tub or shower.  If you need to sit down in the shower, use a plastic, non-slip stool.  Keep the floor dry. Clean up any water that spills on the floor as soon as it  happens.  Remove soap buildup in the tub or shower regularly.  Attach bath mats securely with double-sided non-slip rug tape.  Do not have throw rugs and other things on the floor that can make you trip. What can I do in the bedroom?  Use night lights.  Make sure that you have a light by your bed that is easy to reach.  Do not use any sheets or blankets that are too big for your bed. They should not hang down onto the floor.  Have a firm chair that has side arms. You can use this for support while you get dressed.  Do not have throw rugs and other things on the floor that can make you trip. What can I do in the kitchen?  Clean up any spills right away.  Avoid walking on wet floors.  Keep items that you use a lot in easy-to-reach places.  If you need to reach something above you, use a strong step stool that has a grab bar.  Keep electrical cords out of the way.  Do not use floor polish or wax that makes floors slippery. If you must use wax, use non-skid floor wax.  Do  not have throw rugs and other things on the floor that can make you trip. What can I do with my stairs?  Do not leave any items on the stairs.  Make sure that there are handrails on both sides of the stairs and use them. Fix handrails that are broken or loose. Make sure that handrails are as long as the stairways.  Check any carpeting to make sure that it is firmly attached to the stairs. Fix any carpet that is loose or worn.  Avoid having throw rugs at the top or bottom of the stairs. If you do have throw rugs, attach them to the floor with carpet tape.  Make sure that you have a light switch at the top of the stairs and the bottom of the stairs. If you do not have them, ask someone to add them for you. What else can I do to help prevent falls?  Wear shoes that:  Do not have high heels.  Have rubber bottoms.  Are comfortable and fit you well.  Are closed at the toe. Do not wear sandals.  If you  use a stepladder:  Make sure that it is fully opened. Do not climb a closed stepladder.  Make sure that both sides of the stepladder are locked into place.  Ask someone to hold it for you, if possible.  Clearly mark and make sure that you can see:  Any grab bars or handrails.  First and last steps.  Where the edge of each step is.  Use tools that help you move around (mobility aids) if they are needed. These include:  Canes.  Walkers.  Scooters.  Crutches.  Turn on the lights when you go into a dark area. Replace any light bulbs as soon as they burn out.  Set up your furniture so you have a clear path. Avoid moving your furniture around.  If any of your floors are uneven, fix them.  If there are any pets around you, be aware of where they are.  Review your medicines with your doctor. Some medicines can make you feel dizzy. This can increase your chance of falling. Ask your doctor what other things that you can do to help prevent falls. This information is not intended to replace advice given to you by your health care provider. Make sure you discuss any questions you have with your health care provider. Document Released: 03/27/2009 Document Revised: 11/06/2015 Document Reviewed: 07/05/2014 Elsevier Interactive Patient Education  2017 [name].

## 2018-01-18 NOTE — Progress Notes (Signed)
Patient ID: Curtis Allison, male   DOB: August 22, 1950, 67 y.o.   MRN: [id number]   Location:  [facility] OFFICE  Provider: DR Arletha Grippe  Code Status:  Goals of Care:  Advanced Directives 01/18/2018  Does Patient Have a Medical Advance Directive? No  Would patient like information on creating a medical advance directive? Yes (MAU/Ambulatory/Procedural Areas - Information given)    Chief Complaint  Patient presents with  . Medical Management of Chronic Issues    6 week follow-up, AWV completed today, - fall, - depression   . Medication Management    Stopped Jardiance due to possible side effects. Stopped Lovaza, patient states Dr.Janya Eveland was going to change   . Medication Refill    No refills needed   . Screening    Positive Audit C, Screening (5)     HPI: Patient is a 67 y.o. male seen today for medical management of chronic diseases.  He reports BP nml at home at 130s/80s.   DM - A1c 8.1%. BS at home improved to 140s. No low BS reactions. He takes actos and metformin. He stopped jardiance 2/2 c/a increased urine output with itching sensation at the tip of his penis. He noticed a slight rash. Sx's resolved after stopping jardiance. No numbness/tingling. He plans to see eye provider soon. He takes ACEI and statin  Hyperlipidemia - uncontrolled on crestor. Off lovaza 2/2 cost. TG 509 (prev 484); LDL not calculated due to elevated TG; HDL 40.   Gout - controlled. Uric acid 6.4. Insurance will not cover uloric and he has been taking allopurinol instead; he also takes colchicine.  Rash/atopic dermatitis - stable on eucrisa and prn hydrocortisone. Followed by dermatology  HTN - uncontrolled on lisinopril and amlodipine. BP today 182/94. He is on an ASA daily.  Etoh use - he drinks 3-4 shots of brandy per day. He states he has cut back from 5-6 shots per day  Past Medical History:  Diagnosis Date  . Arthritis   . Diabetes mellitus ([place])    type II ; diagnosed 2006  . Gout   .  Hyperlipidemia   . Hypertension    diagnosed 2006  . Inguinal hernia     Past Surgical History:  Procedure Laterality Date  . INGUINAL HERNIA REPAIR Right 04/01/2014   Procedure: LAPAROSCOPIC RIGHT  INGUINAL HERNIA REPAIR WITH MESH;  Surgeon: Ralene Ok, MD;  Location: [facility] ORS;  Service: General;  Laterality: Right;     reports that he quit smoking about 9 years ago. His smoking use included cigarettes. He has a 40.00 pack-year smoking history. He has never used smokeless tobacco. He reports that he drinks about 1.8 oz of alcohol per week. He reports that he does not use drugs. Social History   Socioeconomic History  . Marital status: Legally Separated    Spouse name: Not on file  . Number of children: Not on file  . Years of education: Not on file  . Highest education level: Not on file  Occupational History  . Not on file  Social Needs  . Financial resource strain: Not hard at all  . Food insecurity:    Worry: Never true    Inability: Never true  . Transportation needs:    Medical: No    Non-medical: No  Tobacco Use  . Smoking status: Former Smoker    Packs/day: 1.00    Years: 40.00    Pack years: 40.00    Types: Cigarettes    Last attempt to  quit: 01/04/2009    Years since quitting: 9.0  . Smokeless tobacco: Never Used  Substance and Sexual Activity  . Alcohol use: Yes    Alcohol/week: 1.8 oz    Types: 3 Shots of liquor per week  . Drug use: No    Types: Marijuana    Comment: Used back in the 70's   . Sexual activity: Not Currently  Lifestyle  . Physical activity:    Days per week: 0 days    Minutes per session: 0 min  . Stress: Only a little  Relationships  . Social connections:    Talks on phone: Three times a week    Gets together: Once a week    Attends religious service: More than 4 times per year    Active member of club or organization: No    Attends meetings of clubs or organizations: Never    Relationship status: Separated  . Intimate  partner violence:    Fear of current or ex partner: No    Emotionally abused: No    Physically abused: No    Forced sexual activity: No  Other Topics Concern  . Not on file  Social History Narrative   Social History      Diet? 40      Do you drink/eat things with caffeine? coffee      Marital status?                  separated                  What year were you married? 1997      Do you live in a house, apartment, assisted living, condo, trailer, etc.? House, apartment, trailer      Is it one or more stories? no      How many persons live in your home? 2      Do you have any pets in your home? (please list) no      Highest level of education completed? 12      Current or past profession: sanitation, [employer]      Do you exercise?                 yes                     Type & how often? Twice a week-sit ups      Advanced Directives      Do you have a living will? no      Do you have a DNR form?                                  If not, do you want to discuss one? no      Do you have signed POA/HPOA for forms? no      Functional Status      Do you have difficulty bathing or dressing yourself? no      Do you have difficulty preparing food or eating? no      Do you have difficulty managing your medications? no      Do you have difficulty managing your finances? no      Do you have difficulty affording your medications? no    Family History  Problem Relation Age of Onset  . Diabetes Mother   . Hypertension Mother   . Lung cancer Mother   .  Diabetes Sister   . Hypertension Sister   . Heart disease Father   . Diabetes Father   . Hypertension Father   . Colon cancer Maternal Grandfather     Allergies  Allergen Reactions  . Penicillins Itching    Outpatient Encounter Medications as of 01/18/2018  Medication Sig  . ACCU-CHEK FASTCLIX LANCETS MISC 1 each by Other route daily. Dx: E11.65  . allopurinol (ZYLOPRIM) 100 MG tablet Take 2 tablets (200 mg  total) by mouth daily.  Marland Kitchen amLODipine (NORVASC) 10 MG tablet Take 1 tablet (10 mg total) by mouth every evening.  Marland Kitchen aspirin EC 81 MG tablet Take 1 tablet (81 mg total) by mouth daily.  Marland Kitchen atenolol (TENORMIN) 50 MG tablet TAKE 1 TABLET BY MOUTH EVERY MORNING  . Blood Glucose Monitoring Suppl (ACCU-CHEK GUIDE) w/Device KIT 1 each by Other route daily. Dx: E11.65  . colchicine 0.6 MG tablet Take 0.6 mg by mouth. Take two tablets, Then one tablet every hour for flare ups.  Marland Kitchen glucose blood (ACCU-CHEK GUIDE) test strip 1 each by Other route daily. Dx: E11.65  . hydrocortisone cream 1 % Apply 1 application topically once as needed for itching.  . Lancets Misc. (ACCU-CHEK FASTCLIX LANCET) KIT 1 each by Other route daily. Dx:E11.65  . lisinopril (PRINIVIL,ZESTRIL) 40 MG tablet TAKE 1 TABLET BY MOUTH EVERY MORNING  . metFORMIN (GLUCOPHAGE-XR) 500 MG 24 hr tablet Take 2 tablets (1,000 mg total) by mouth 2 (two) times daily.  . naproxen sodium (ALEVE) 220 MG tablet Take 220 mg daily as needed by mouth.  . pioglitazone (ACTOS) 30 MG tablet TAKE 1 TABLET BY MOUTH DAILY  . rosuvastatin (CRESTOR) 10 MG tablet Take 1 tablet (10 mg total) by mouth daily.  . Tetrahydrozoline HCl (VISINE EXTRA OP) Place 2 drops into both eyes once as needed (dry eyes.).  . [DISCONTINUED] empagliflozin (JARDIANCE) 25 MG TABS tablet Take 25 mg by mouth daily. (Patient not taking: Reported on 01/18/2018)  . [DISCONTINUED] omega-3 acid ethyl esters (LOVAZA) 1 g capsule Take 2 capsules (2 g total) by mouth 2 (two) times daily. (Patient not taking: Reported on 01/18/2018)   No facility-administered encounter medications on file as of 01/18/2018.     Review of Systems:  Review of Systems  Genitourinary: Negative for difficulty urinating.  Musculoskeletal: Negative for arthralgias.  Skin: Negative for rash.  All other systems reviewed and are negative.   Health Maintenance  Topic Date Due  . INFLUENZA VACCINE  01/12/2018  . TETANUS/TDAP   05/09/2018 (Originally 02/05/1971)  . Hepatitis C Screening  05/09/2018 (Originally February 23, 1951)  . HIV Screening  05/09/2018 (Originally 02/05/1967)  . PNA vac Low Risk Adult (1 of 2 - PCV13) 05/09/2018 (Originally 02/04/2017)  . OPHTHALMOLOGY EXAM  08/13/2018 (Originally 02/04/1962)  . COLONOSCOPY  08/13/2018 (Originally 01/20/2017)  . HEMOGLOBIN A1C  05/31/2018  . FOOT EXAM  11/26/2018    Physical Exam: Vitals:   01/18/18 1130  BP: (!) 182/94  Pulse: 88  Temp: 98.2 F (36.8 C)  TempSrc: Oral  SpO2: 97%  Weight: 229 lb (103.9 kg)  Height: '5\' 10"'$  (1.778 m)   Body mass index is 32.86 kg/m. Physical Exam  Constitutional: He is oriented to person, place, and time. He appears well-developed and well-nourished.  HENT:  Mouth/Throat: Oropharynx is clear and moist.  MMM; no oral thrush  Eyes: Pupils are equal, round, and reactive to light. No scleral icterus.  Neck: Neck supple. Carotid bruit is not present. No thyromegaly present.  Cardiovascular:  Normal rate, regular rhythm and intact distal pulses. Exam reveals no gallop and no friction rub.  Murmur (1/6 SEM) heard. Trace  BLE swelling. No calf TTP  Pulmonary/Chest: Effort normal and breath sounds normal. He has no wheezes. He has no rales. He exhibits no tenderness.  Abdominal: Soft. Bowel sounds are normal. He exhibits no distension, no abdominal bruit, no pulsatile midline mass and no mass. There is no hepatomegaly. There is no tenderness. There is no rebound and no guarding.  obese  Musculoskeletal: He exhibits edema.  Lymphadenopathy:    He has no cervical adenopathy.  Neurological: He is alert and oriented to person, place, and time. He has normal reflexes.  Skin: Skin is warm and dry. No rash noted.  Psychiatric: He has a normal mood and affect. His behavior is normal. Judgment and thought content normal.    Labs reviewed: Basic Metabolic Panel: Recent Labs    06/10/17 0839 08/09/17 0828 11/29/17 0932  NA 140 140 141    K 3.9 3.5 3.6  CL 102 101 103  CO2 _0 GLUCOSE 158* 219* 142*  BUN _1 CREATININE 1.15 1.00 0.99  CALCIUM 9.7 9.7 9.6   Liver Function Tests: Recent Labs    05/04/17 0939 08/09/17 0828 11/29/17 0932  AST 21 18  --   ALT _2 BILITOT 0.5 0.7  --   PROT 7.3 6.9  --    No results for input(s): LIPASE, AMYLASE in the last 8760 hours. No results for input(s): AMMONIA in the last 8760 hours. CBC: Recent Labs    05/04/17 0939  WBC 8.2  NEUTROABS 5,092  HGB 16.2  HCT 46.3  MCV 91.5  PLT 218   Lipid Panel: Recent Labs    06/10/17 0839 08/09/17 0828 11/29/17 0932  CHOL 138 146 152  HDL 44 45 40*  TRIG 530* 484* 509*  CHOLHDL 3.1 3.2 3.8   Lab Results  Component Value Date   HGBA1C 8.1 (H) 11/29/2017    Procedures since last visit: No results found.  Assessment/Plan     ICD-10-CM   1. Type 2 diabetes mellitus with hyperglycemia, without long-term current use of insulin (HCC) E11.65 CMP with eGFR([facility])    Hemoglobin A1c  2. Hyperlipidemia associated with type 2 diabetes mellitus (HCC) E11.69 Lipid Panel   E78.5   3. Hypertension associated with diabetes ([place]) E11.59 CMP with eGFR([facility])   I10   4. ETOH abuse F10.10 CMP with eGFR([facility])  5. Insomnia, unspecified type G47.00    Etoh cessation discussed and highly urged  TAKE ALL MEDICATIONS AS ORDERED  START OTC MELATONIN 3-6MG AT BEDTIME TO HELP SLEEP  Increase exercise as tolerated; watch fatty foods - avoid pork products and high salt foods  Follow up in 3 mos with Janett Billow for DM, HTN, Etoh use and hyperlipidemia. Fasting labs prior to appt    Curtis Allison  [facility] [address] [place], [place] [address] [phone] Cell (Monday-Friday 8 AM - 5 PM) [phone] After 5 PM and follow prompts

## 2018-02-07 ENCOUNTER — Other Ambulatory Visit: Payer: Self-pay

## 2018-02-07 DIAGNOSIS — F1010 Alcohol abuse, uncomplicated: Secondary | ICD-10-CM

## 2018-02-07 DIAGNOSIS — E785 Hyperlipidemia, unspecified: Secondary | ICD-10-CM

## 2018-02-07 DIAGNOSIS — I10 Essential (primary) hypertension: Secondary | ICD-10-CM

## 2018-02-07 DIAGNOSIS — E1169 Type 2 diabetes mellitus with other specified complication: Secondary | ICD-10-CM

## 2018-02-07 DIAGNOSIS — E1159 Type 2 diabetes mellitus with other circulatory complications: Secondary | ICD-10-CM

## 2018-02-07 DIAGNOSIS — E1165 Type 2 diabetes mellitus with hyperglycemia: Secondary | ICD-10-CM

## 2018-02-09 LAB — HM DIABETES EYE EXAM

## 2018-02-15 ENCOUNTER — Other Ambulatory Visit: Payer: Self-pay | Admitting: Internal Medicine

## 2018-02-15 DIAGNOSIS — E1169 Type 2 diabetes mellitus with other specified complication: Secondary | ICD-10-CM

## 2018-02-15 DIAGNOSIS — E785 Hyperlipidemia, unspecified: Principal | ICD-10-CM

## 2018-02-22 ENCOUNTER — Encounter: Payer: Self-pay | Admitting: *Deleted

## 2018-03-22 ENCOUNTER — Other Ambulatory Visit: Payer: Self-pay | Admitting: Nurse Practitioner

## 2018-03-22 DIAGNOSIS — I10 Essential (primary) hypertension: Secondary | ICD-10-CM

## 2018-04-14 ENCOUNTER — Other Ambulatory Visit: Payer: Medicare Other

## 2018-04-14 DIAGNOSIS — E1165 Type 2 diabetes mellitus with hyperglycemia: Secondary | ICD-10-CM

## 2018-04-14 DIAGNOSIS — I10 Essential (primary) hypertension: Secondary | ICD-10-CM

## 2018-04-14 DIAGNOSIS — E785 Hyperlipidemia, unspecified: Secondary | ICD-10-CM

## 2018-04-14 DIAGNOSIS — E1159 Type 2 diabetes mellitus with other circulatory complications: Secondary | ICD-10-CM

## 2018-04-14 DIAGNOSIS — E1169 Type 2 diabetes mellitus with other specified complication: Secondary | ICD-10-CM

## 2018-04-14 DIAGNOSIS — F1010 Alcohol abuse, uncomplicated: Secondary | ICD-10-CM

## 2018-04-15 LAB — LIPID PANEL
CHOL/HDL RATIO: 3.2 (calc) (ref <5.0)
CHOLESTEROL: 142 mg/dL (ref <200)
HDL: 44 mg/dL (ref >40)
LDL Cholesterol (Calc): 57 mg/dL (calc)
Non-HDL Cholesterol (Calc): 98 mg/dL (calc) (ref <130)
Triglycerides: 394 mg/dL — ABNORMAL HIGH (ref <150)

## 2018-04-15 LAB — COMPLETE METABOLIC PANEL WITH GFR
AG RATIO: 2 (calc) (ref 1.0–2.5)
ALBUMIN MSPROF: 4.5 g/dL (ref 3.6–5.1)
ALT: 15 U/L (ref 9–46)
AST: 17 U/L (ref 10–35)
Alkaline phosphatase (APISO): 55 U/L (ref 40–115)
BILIRUBIN TOTAL: 0.7 mg/dL (ref 0.2–1.2)
BUN: 14 mg/dL (ref 7–25)
CO2: 23 mmol/L (ref 20–32)
Calcium: 9.2 mg/dL (ref 8.6–10.3)
Chloride: 103 mmol/L (ref 98–110)
Creat: 1.16 mg/dL (ref 0.70–1.25)
GFR, EST AFRICAN AMERICAN: 76 mL/min/{1.73_m2} (ref >=60)
GFR, Est Non African American: 65 mL/min/{1.73_m2} (ref >=60)
GLOBULIN: 2.3 g/dL (ref 1.9–3.7)
Glucose, Bld: 171 mg/dL — ABNORMAL HIGH (ref 65–99)
POTASSIUM: 3.7 mmol/L (ref 3.5–5.3)
SODIUM: 140 mmol/L (ref 135–146)
TOTAL PROTEIN: 6.8 g/dL (ref 6.1–8.1)

## 2018-04-15 LAB — HEMOGLOBIN A1C
EAG (MMOL/L): 9.3 (calc)
Hgb A1c MFr Bld: 7.5 % of total Hgb — ABNORMAL HIGH (ref <5.7)
Mean Plasma Glucose: 169 (calc)

## 2018-04-18 ENCOUNTER — Other Ambulatory Visit: Payer: Self-pay | Admitting: Nurse Practitioner

## 2018-04-18 DIAGNOSIS — I10 Essential (primary) hypertension: Secondary | ICD-10-CM

## 2018-04-20 ENCOUNTER — Encounter: Payer: Self-pay | Admitting: Nurse Practitioner

## 2018-04-20 ENCOUNTER — Ambulatory Visit (INDEPENDENT_AMBULATORY_CARE_PROVIDER_SITE_OTHER): Payer: Medicare Other | Admitting: Nurse Practitioner

## 2018-04-20 VITALS — BP 152/98 | HR 83 | Temp 98.4°F | Ht 70.0 in | Wt 237.0 lb

## 2018-04-20 DIAGNOSIS — E1169 Type 2 diabetes mellitus with other specified complication: Secondary | ICD-10-CM

## 2018-04-20 DIAGNOSIS — E1165 Type 2 diabetes mellitus with hyperglycemia: Secondary | ICD-10-CM | POA: Diagnosis not present

## 2018-04-20 DIAGNOSIS — E785 Hyperlipidemia, unspecified: Secondary | ICD-10-CM

## 2018-04-20 DIAGNOSIS — M1A9XX0 Chronic gout, unspecified, without tophus (tophi): Secondary | ICD-10-CM | POA: Diagnosis not present

## 2018-04-20 DIAGNOSIS — E1159 Type 2 diabetes mellitus with other circulatory complications: Secondary | ICD-10-CM

## 2018-04-20 DIAGNOSIS — K521 Toxic gastroenteritis and colitis: Secondary | ICD-10-CM

## 2018-04-20 DIAGNOSIS — I10 Essential (primary) hypertension: Secondary | ICD-10-CM

## 2018-04-20 DIAGNOSIS — L209 Atopic dermatitis, unspecified: Secondary | ICD-10-CM

## 2018-04-20 MED ORDER — METFORMIN HCL ER (MOD) 1000 MG PO TB24: 1000.0000 mg | ORAL_TABLET | Freq: Two times a day (BID) | ORAL | 2 refills | Status: DC

## 2018-04-20 NOTE — Patient Instructions (Addendum)
To use benefiber in drink of choice 1-2 times daily to help with diarrhea.   Metformin changed to 1000 mg tablet to take twice daily to help with pill burden.   Continue to work on dietary changes   Follow up in 3 months with lab work before visit

## 2018-04-20 NOTE — Progress Notes (Signed)
Careteam: Patient Care Team: Lauree Chandler, NP as PCP - General (Geriatric Medicine) Crista Luria, MD as Attending Physician (Dermatology) Joseph Art, OD (Optometry)  Advanced Directive information Does Patient Have a Medical Advance Directive?: No  Allergies  Allergen Reactions  . Penicillins Itching  . Jardiance [Empagliflozin] Itching    Increased urination/penile rash    Chief Complaint  Patient presents with  . Medical Management of Chronic Issues    3 month follow up with fasting labs prior(copy of labs given to patient)      HPI: Patient is a 67 y.o. male seen in the office today for routine follow up.    DM - A1c 7.5%. BS at home improved to 112-135. No low BS reactions. He takes actos and metformin. He stopped jardiance 2/2 c/a increased urine output with itching sensation at the tip of his penis. He noticed a slight rash. Sx's resolved after stopping jardiance. having diarrhea with metformin. Will flare up on him and have it once a month.   No numbness/tingling. He plans to see eye provider soon. He takes ACEI and statin  Hyperlipidemia - uncontrolled on crestor. Off lovaza 2/2 cost.   Gout - controlled. Uric acid 6.4. On allopurinol as needed ; he also takes colchicine if he feels a flare.  Rash/atopic dermatitis - stable on eucrisa and prn hydrocortisone. Followed by dermatology  HTN - uncontrolled on lisinopril and amlodipine. BP today 178/94 He is on an ASA daily. Taking norvasc 10 mg daily and atenolol 50 mg daily and lisinopril 40 mg daily    Declines pneumococcal and flu vaccine today   Had hernia surgery and feels like he needs antibiotic for flare when it acts up. No current pain or "flare"   Review of Systems:  Review of Systems  Constitutional: Negative for chills, fever and weight loss.  HENT: Negative for tinnitus.   Respiratory: Negative for cough, sputum production and shortness of breath.   Cardiovascular: Negative for chest  pain, palpitations and leg swelling.  Gastrointestinal: Positive for diarrhea (occasional). Negative for abdominal pain, constipation and heartburn.  Genitourinary: Negative for dysuria, frequency and urgency.  Musculoskeletal: Negative for back pain, falls, joint pain and myalgias.  Skin: Negative.   Neurological: Negative for dizziness and headaches.  Psychiatric/Behavioral: Negative for depression and memory loss. The patient does not have insomnia.     Past Medical History:  Diagnosis Date  . Arthritis   . Diabetes mellitus ([place])    type II ; diagnosed 2006  . Gout   . Hyperlipidemia   . Hypertension    diagnosed 2006  . Inguinal hernia    Past Surgical History:  Procedure Laterality Date  . INGUINAL HERNIA REPAIR Right 04/01/2014   Procedure: LAPAROSCOPIC RIGHT  INGUINAL HERNIA REPAIR WITH MESH;  Surgeon: Ralene Ok, MD;  Location: [facility];  Service: General;  Laterality: Right;   Social History:   reports that he quit smoking about 9 years ago. His smoking use included cigarettes. He has a 40.00 pack-year smoking history. He has never used smokeless tobacco. He reports that he drinks about 3.0 standard drinks of alcohol per week. He reports that he does not use drugs.  Family History  Problem Relation Age of Onset  . Diabetes Mother   . Hypertension Mother   . Lung cancer Mother   . Diabetes Sister   . Hypertension Sister   . Heart disease Father   . Diabetes Father   . Hypertension Father   .  Colon cancer Maternal Grandfather     Medications: Patient's Medications  New Prescriptions   No medications on file  Previous Medications   ACCU-CHEK FASTCLIX LANCETS MISC    1 each by Other route daily. Dx: E11.65   ALLOPURINOL (ZYLOPRIM) 100 MG TABLET    Take 2 tablets (200 mg total) by mouth daily.   AMLODIPINE (NORVASC) 10 MG TABLET    TAKE 1 TABLET BY MOUTH EVERY EVENING   ASPIRIN EC 81 MG TABLET    Take 1 tablet (81 mg total) by mouth daily.   ATENOLOL  (TENORMIN) 50 MG TABLET    TAKE 1 TABLET BY MOUTH EVERY MORNING   BLOOD GLUCOSE MONITORING SUPPL (ACCU-CHEK GUIDE) W/DEVICE KIT    1 each by Other route daily. Dx: E11.65   COLCHICINE 0.6 MG TABLET    Take 0.6 mg by mouth. Take two tablets, Then one tablet every hour for flare ups.   GLUCOSE BLOOD (ACCU-CHEK GUIDE) TEST STRIP    1 each by Other route daily. Dx: E11.65   HYDROCORTISONE CREAM 1 %    Apply 1 application topically once as needed for itching.   LANCETS MISC. (ACCU-CHEK FASTCLIX LANCET) KIT    1 each by Other route daily. Dx:E11.65   LISINOPRIL (PRINIVIL,ZESTRIL) 40 MG TABLET    TAKE ONE TABLET BY MOUTH EVERY MORNING   METFORMIN (GLUCOPHAGE-XR) 500 MG 24 HR TABLET    Take 2 tablets (1,000 mg total) by mouth 2 (two) times daily.   NAPROXEN SODIUM (ALEVE) 220 MG TABLET    Take 220 mg daily as needed by mouth.   PIOGLITAZONE (ACTOS) 30 MG TABLET    TAKE 1 TABLET BY MOUTH DAILY   ROSUVASTATIN (CRESTOR) 10 MG TABLET    TAKE ONE TABLET BY MOUTH DAILY   TETRAHYDROZOLINE HCL (VISINE EXTRA OP)    Place 2 drops into both eyes once as needed (dry eyes.).  Modified Medications   No medications on file  Discontinued Medications   No medications on file     Physical Exam:  Vitals:   04/20/18 1449 04/20/18 1531  BP: (!) 178/94 (!) 152/98  Pulse: 83   Temp: 98.4 F (36.9 C)   TempSrc: Oral   SpO2: 96%   Weight: 237 lb (107.5 kg)   Height: '5\' 10"'  (1.778 m)    Body mass index is 34.01 kg/m.  Physical Exam  Constitutional: He is oriented to person, place, and time. He appears well-developed and well-nourished.  HENT:  Mouth/Throat: Oropharynx is clear and moist.  MMM; no oral thrush  Eyes: Pupils are equal, round, and reactive to light. No scleral icterus.  Neck: Neck supple. Carotid bruit is not present. No thyromegaly present.  Cardiovascular: Normal rate, regular rhythm and intact distal pulses. Exam reveals no gallop and no friction rub.  Murmur (1/6 SEM) heard. Trace  BLE  swelling. No calf TTP  Pulmonary/Chest: Effort normal and breath sounds normal. He has no wheezes. He has no rales. He exhibits no tenderness.  Abdominal: Soft. Bowel sounds are normal. He exhibits no distension, no abdominal bruit, no pulsatile midline mass and no mass. There is no hepatomegaly. There is no tenderness. There is no rebound and no guarding.  obese  Musculoskeletal: He exhibits no edema.  Lymphadenopathy:    He has no cervical adenopathy.  Neurological: He is alert and oriented to person, place, and time. He has normal reflexes.  Skin: Skin is warm and dry. No rash noted.  Psychiatric: He has a normal  mood and affect. His behavior is normal. Judgment and thought content normal.    Labs reviewed: Basic Metabolic Panel: Recent Labs    08/09/17 0828 11/29/17 0932 04/14/18 0920  NA 140 141 140  K 3.5 3.6 3.7  CL 101 103 103  CO2 '28 26 23  ' GLUCOSE 219* 142* 171*  BUN '12 10 14  ' CREATININE 1.00 0.99 1.16  CALCIUM 9.7 9.6 9.2   Liver Function Tests: Recent Labs    05/04/17 0939 08/09/17 0828 11/29/17 0932 04/14/18 0920  AST 21 18  --  17  ALT '21 18 17 15  ' BILITOT 0.5 0.7  --  0.7  PROT 7.3 6.9  --  6.8   No results for input(s): LIPASE, AMYLASE in the last 8760 hours. No results for input(s): AMMONIA in the last 8760 hours. CBC: Recent Labs    05/04/17 0939  WBC 8.2  NEUTROABS 5,092  HGB 16.2  HCT 46.3  MCV 91.5  PLT 218   Lipid Panel: Recent Labs    05/04/17 0939  08/09/17 0828 11/29/17 0932 04/14/18 0920  CHOL 325*   < > 146 152 142  HDL 33*   < > 45 40* 44  LDLCALC  --   --   --   --  57  TRIG 1,686*   < > 484* 509* 394*  CHOLHDL 9.8*   < > 3.2 3.8 3.2   < > = values in this interval not displayed.   TSH: No results for input(s): TSH in the last 8760 hours. A1C: Lab Results  Component Value Date   HGBA1C 7.5 (H) 04/14/2018     Assessment/Plan 1. Type 2 diabetes mellitus with hyperglycemia, without long-term current use of insulin  (HCC) -A1c improved but not at goal. He has cut back to metformin 2 tablets daily because "4 was too many" also having occasional diarrhea but not often.  Encouraged benefiber daily to bulk stools -discussed adding GLP-1 but he was very resistant to any injectable medication and states he will not do that.  -to continue current regimen with dietary modifications, will change metformin to 1000 mg tablet for him to take twice daily. - metFORMIN (GLUMETZA) 1000 MG (MOD) 24 hr tablet; Take 1 tablet (1,000 mg total) by mouth 2 (two) times daily.  Dispense: 60 tablet; Refill: 2  2. Hyperlipidemia associated with type 2 diabetes mellitus ([place]) Unable to afford lovaza, triglycerides have improved but not at goal. Consider adding fenofibrate and next OV, continues on crestor 10 mg daily LDL at goal. Lifestyle modifications encouraged.   3. Hypertension associated with diabetes ([place]) Improved blood pressure with recheck however still not at goal. Pt reports blood pressure readings improved at home, recommended to bring to next office visit. Continue dietary modifications  4. Chronic gout without tophus, unspecified cause, unspecified site No recent flares, does not take anything routinely just when he feels a flare is coming on. Encouraged use of routine allopurinol to avoid flare.   5. Atopic dermatitis, unspecified type Stable on eucrisa and prn hydrocortisone  6. Diarrhea due to drug - encouraged to use benefiber twice daily  Next appt: 3 months with labs before  K. [place], [facility] [phone]

## 2018-06-20 ENCOUNTER — Other Ambulatory Visit: Payer: Self-pay | Admitting: Nurse Practitioner

## 2018-06-20 DIAGNOSIS — I10 Essential (primary) hypertension: Secondary | ICD-10-CM

## 2018-07-18 ENCOUNTER — Other Ambulatory Visit: Payer: Medicare Other

## 2018-07-18 DIAGNOSIS — E785 Hyperlipidemia, unspecified: Secondary | ICD-10-CM

## 2018-07-18 DIAGNOSIS — M1A9XX0 Chronic gout, unspecified, without tophus (tophi): Secondary | ICD-10-CM

## 2018-07-18 DIAGNOSIS — E1169 Type 2 diabetes mellitus with other specified complication: Secondary | ICD-10-CM

## 2018-07-18 DIAGNOSIS — E1165 Type 2 diabetes mellitus with hyperglycemia: Secondary | ICD-10-CM

## 2018-07-19 ENCOUNTER — Other Ambulatory Visit: Payer: Self-pay | Admitting: Nurse Practitioner

## 2018-07-19 DIAGNOSIS — E118 Type 2 diabetes mellitus with unspecified complications: Secondary | ICD-10-CM

## 2018-07-19 LAB — COMPLETE METABOLIC PANEL WITH GFR
AG Ratio: 2 (calc) (ref 1.0–2.5)
ALT: 18 U/L (ref 9–46)
AST: 18 U/L (ref 10–35)
Albumin: 4.6 g/dL (ref 3.6–5.1)
Alkaline phosphatase (APISO): 59 U/L (ref 35–144)
BUN: 12 mg/dL (ref 7–25)
CALCIUM: 9.7 mg/dL (ref 8.6–10.3)
CO2: 25 mmol/L (ref 20–32)
CREATININE: 1.08 mg/dL (ref 0.70–1.25)
Chloride: 102 mmol/L (ref 98–110)
GFR, EST NON AFRICAN AMERICAN: 71 mL/min/{1.73_m2} (ref >=60)
GFR, Est African American: 82 mL/min/{1.73_m2} (ref >=60)
Globulin: 2.3 g/dL (calc) (ref 1.9–3.7)
Glucose, Bld: 149 mg/dL — ABNORMAL HIGH (ref 65–99)
Potassium: 3.7 mmol/L (ref 3.5–5.3)
Sodium: 140 mmol/L (ref 135–146)
Total Bilirubin: 0.5 mg/dL (ref 0.2–1.2)
Total Protein: 6.9 g/dL (ref 6.1–8.1)

## 2018-07-19 LAB — CBC WITH DIFFERENTIAL/PLATELET
Absolute Monocytes: 760 cells/uL (ref 200–950)
Basophils Absolute: 50 cells/uL (ref 0–200)
Basophils Relative: 0.5 %
Eosinophils Absolute: 140 cells/uL (ref 15–500)
Eosinophils Relative: 1.4 %
HCT: 41.6 % (ref 38.5–50.0)
Hemoglobin: 14.8 g/dL (ref 13.2–17.1)
Lymphs Abs: 4010 cells/uL — ABNORMAL HIGH (ref 850–3900)
MCH: 32.5 pg (ref 27.0–33.0)
MCHC: 35.6 g/dL (ref 32.0–36.0)
MCV: 91.2 fL (ref 80.0–100.0)
MPV: 11.7 fL (ref 7.5–12.5)
Monocytes Relative: 7.6 %
Neutro Abs: 5040 cells/uL (ref 1500–7800)
Neutrophils Relative %: 50.4 %
Platelets: 228 10*3/uL (ref 140–400)
RBC: 4.56 10*6/uL (ref 4.20–5.80)
RDW: 12.9 % (ref 11.0–15.0)
Total Lymphocyte: 40.1 %
WBC: 10 10*3/uL (ref 3.8–10.8)

## 2018-07-19 LAB — HEMOGLOBIN A1C
Hgb A1c MFr Bld: 8.1 % of total Hgb — ABNORMAL HIGH (ref <5.7)
MEAN PLASMA GLUCOSE: 186 (calc)
eAG (mmol/L): 10.3 (calc)

## 2018-07-19 LAB — URIC ACID: URIC ACID, SERUM: 8.3 mg/dL — AB (ref 4.0–8.0)

## 2018-07-19 LAB — LIPID PANEL
Cholesterol: 184 mg/dL (ref <200)
HDL: 40 mg/dL (ref >=40)
Non-HDL Cholesterol (Calc): 144 mg/dL (calc) — ABNORMAL HIGH (ref <130)
TRIGLYCERIDES: 752 mg/dL — AB (ref <150)
Total CHOL/HDL Ratio: 4.6 (calc) (ref <5.0)

## 2018-07-19 NOTE — Telephone Encounter (Signed)
A1c 8.1 on 07/18/2018, will you keep patient on medication at current dose and instruction

## 2018-07-24 ENCOUNTER — Ambulatory Visit (INDEPENDENT_AMBULATORY_CARE_PROVIDER_SITE_OTHER): Payer: Medicare Other | Admitting: Nurse Practitioner

## 2018-07-24 ENCOUNTER — Encounter: Payer: Self-pay | Admitting: Nurse Practitioner

## 2018-07-24 VITALS — BP 160/94 | HR 97 | Temp 98.5°F | Ht 70.0 in | Wt 244.0 lb

## 2018-07-24 DIAGNOSIS — M1A9XX0 Chronic gout, unspecified, without tophus (tophi): Secondary | ICD-10-CM

## 2018-07-24 DIAGNOSIS — I152 Hypertension secondary to endocrine disorders: Secondary | ICD-10-CM

## 2018-07-24 DIAGNOSIS — E1169 Type 2 diabetes mellitus with other specified complication: Secondary | ICD-10-CM | POA: Diagnosis not present

## 2018-07-24 DIAGNOSIS — E1165 Type 2 diabetes mellitus with hyperglycemia: Secondary | ICD-10-CM

## 2018-07-24 DIAGNOSIS — I10 Essential (primary) hypertension: Secondary | ICD-10-CM

## 2018-07-24 DIAGNOSIS — E1159 Type 2 diabetes mellitus with other circulatory complications: Secondary | ICD-10-CM | POA: Diagnosis not present

## 2018-07-24 DIAGNOSIS — E785 Hyperlipidemia, unspecified: Secondary | ICD-10-CM

## 2018-07-24 MED ORDER — GLUCOSE BLOOD VI STRP: 1.0000 | ORAL_STRIP | Freq: Every day | 6 refills | Status: DC

## 2018-07-24 MED ORDER — ACCU-CHEK FASTCLIX LANCETS MISC: 1.0000 | Freq: Every day | 6 refills | Status: DC

## 2018-07-24 MED ORDER — SITAGLIPTIN PHOSPHATE 100 MG PO TABS: 100.0000 mg | ORAL_TABLET | Freq: Every day | ORAL | 3 refills | Status: DC

## 2018-07-24 NOTE — Progress Notes (Signed)
Careteam: Patient Care Team: Lauree Chandler, NP as PCP - General (Geriatric Medicine) Crista Luria, MD as Attending Physician (Dermatology) Joseph Art, OD (Optometry)  Advanced Directive information Does Patient Have a Medical Advance Directive?: No, Would patient like information on creating a medical advance directive?: No - Patient declined  Allergies  Allergen Reactions  . Penicillins Itching  . Jardiance [Empagliflozin] Itching    Increased urination/penile rash    Chief Complaint  Patient presents with  . Medical Management of Chronic Issues    3 month follow up and discuss lab results,patient declined flu vaccine     HPI: Patient is a 69 y.o. male seen in the office today for routine follow up.   Gout- uric acid 8.3 elevated from 7.3, states he is taking allopurinol 100 mg but not daily, no recent flares.   Hyperlipidemia/hypertriglyceridemia- Reports he eats late and night before visit he ate fried chicken late. Taking crestor 10 mg by mouth daily.   DM- taking metformin 1000 mg daily with actos 30 mg by mouth.   HTN- controlled on lisinopril, norvasc for blood pressure. Blood pressure better controlled at home   Declines all vaccines.  No exercise currently. Sedentary at this time.  Review of Systems:  Review of Systems  Constitutional: Negative for chills, fever and weight loss.  HENT: Negative for tinnitus.   Respiratory: Negative for cough, sputum production and shortness of breath.   Cardiovascular: Negative for chest pain, palpitations and leg swelling.  Gastrointestinal: Negative for abdominal pain, constipation, diarrhea and heartburn.  Genitourinary: Negative for dysuria, frequency and urgency.  Musculoskeletal: Negative for back pain, falls, joint pain and myalgias.  Skin: Negative.   Neurological: Negative for dizziness and headaches.  Psychiatric/Behavioral: Negative for depression and memory loss. The patient does not have insomnia.       Past Medical History:  Diagnosis Date  . Arthritis   . Diabetes mellitus ([place])    type II ; diagnosed 2006  . Gout   . Hyperlipidemia   . Hypertension    diagnosed 2006  . Inguinal hernia    Past Surgical History:  Procedure Laterality Date  . INGUINAL HERNIA REPAIR Right 04/01/2014   Procedure: LAPAROSCOPIC RIGHT  INGUINAL HERNIA REPAIR WITH MESH;  Surgeon: Ralene Ok, MD;  Location: [facility];  Service: General;  Laterality: Right;   Social History:   reports that he quit smoking about 9 years ago. His smoking use included cigarettes. He has a 40.00 pack-year smoking history. He has never used smokeless tobacco. He reports current alcohol use of about 3.0 standard drinks of alcohol per week. He reports that he does not use drugs.  Family History  Problem Relation Age of Onset  . Diabetes Mother   . Hypertension Mother   . Lung cancer Mother   . Diabetes Sister   . Hypertension Sister   . Heart disease Father   . Diabetes Father   . Hypertension Father   . Colon cancer Maternal Grandfather     Medications: Patient's Medications  New Prescriptions   No medications on file  Previous Medications   ACCU-CHEK FASTCLIX LANCETS MISC    1 each by Other route daily. Dx: E11.65   ALLOPURINOL (ZYLOPRIM) 100 MG TABLET    Take 2 tablets (200 mg total) by mouth daily.   AMLODIPINE (NORVASC) 10 MG TABLET    TAKE 1 TABLET BY MOUTH EVERY EVENING   ASPIRIN EC 81 MG TABLET    Take 1 tablet (81  mg total) by mouth daily.   ATENOLOL (TENORMIN) 50 MG TABLET    TAKE ONE TABLET BY MOUTH EVERY MORNING   BLOOD GLUCOSE MONITORING SUPPL (ACCU-CHEK GUIDE) W/DEVICE KIT    1 each by Other route daily. Dx: E11.65   COLCHICINE 0.6 MG TABLET    Take 0.6 mg by mouth. Take two tablets, Then one tablet every hour for flare ups.   GLUCOSE BLOOD (ACCU-CHEK GUIDE) TEST STRIP    1 each by Other route daily. Dx: E11.65   HYDROCORTISONE CREAM 1 %    Apply 1 application topically once as needed for itching.    LANCETS MISC. (ACCU-CHEK FASTCLIX LANCET) KIT    1 each by Other route daily. Dx:E11.65   LISINOPRIL (PRINIVIL,ZESTRIL) 40 MG TABLET    TAKE ONE TABLET BY MOUTH EVERY MORNING   METFORMIN (GLUMETZA) 1000 MG (MOD) 24 HR TABLET    Take 1 tablet (1,000 mg total) by mouth 2 (two) times daily.   NAPROXEN SODIUM (ALEVE) 220 MG TABLET    Take 220 mg daily as needed by mouth.   PIOGLITAZONE (ACTOS) 30 MG TABLET    TAKE ONE TABLET BY MOUTH DAILY   ROSUVASTATIN (CRESTOR) 10 MG TABLET    TAKE ONE TABLET BY MOUTH DAILY   TETRAHYDROZOLINE HCL (VISINE EXTRA OP)    Place 2 drops into both eyes once as needed (dry eyes.).  Modified Medications   No medications on file  Discontinued Medications   No medications on file     Physical Exam:  Vitals:   07/24/18 1456  BP: (!) 160/94  Pulse: 97  Temp: 98.5 F (36.9 C)  TempSrc: Oral  SpO2: 98%  Weight: 244 lb (110.7 kg)  Height: '5\' 10"'  (1.778 m)   Body mass index is 35.01 kg/m.  Physical Exam Constitutional:      Appearance: He is well-developed.  Eyes:     General: No scleral icterus.    Pupils: Pupils are equal, round, and reactive to light.  Neck:     Musculoskeletal: Neck supple.     Thyroid: No thyromegaly.     Vascular: No carotid bruit.  Cardiovascular:     Rate and Rhythm: Normal rate and regular rhythm.     Heart sounds: Murmur (1/6 SEM) present. No friction rub. No gallop.   Pulmonary:     Effort: Pulmonary effort is normal.     Breath sounds: Normal breath sounds. No wheezing or rales.  Chest:     Chest wall: No tenderness.  Abdominal:     General: Bowel sounds are normal. There is no distension or abdominal bruit.     Palpations: Abdomen is soft. There is no hepatomegaly, mass or pulsatile mass.     Tenderness: There is no abdominal tenderness. There is no guarding or rebound.     Comments: obese  Lymphadenopathy:     Cervical: No cervical adenopathy.  Skin:    General: Skin is warm and dry.     Findings: No rash.   Neurological:     Mental Status: He is alert and oriented to person, place, and time.     Deep Tendon Reflexes: Reflexes are normal and symmetric.  Psychiatric:        Behavior: Behavior normal.        Thought Content: Thought content normal.        Judgment: Judgment normal.     Labs reviewed: Basic Metabolic Panel: Recent Labs    11/29/17 0932 04/14/18 0920 07/18/18 5859  NA 141 140 140  K 3.6 3.7 3.7  CL 103 103 102  CO2 '26 23 25  ' GLUCOSE 142* 171* 149*  BUN '10 14 12  ' CREATININE 0.99 1.16 1.08  CALCIUM 9.6 9.2 9.7   Liver Function Tests: Recent Labs    08/09/17 0828 11/29/17 0932 04/14/18 0920 07/18/18 0921  AST 18  --  17 18  ALT '18 17 15 18  ' BILITOT 0.7  --  0.7 0.5  PROT 6.9  --  6.8 6.9   No results for input(s): LIPASE, AMYLASE in the last 8760 hours. No results for input(s): AMMONIA in the last 8760 hours. CBC: Recent Labs    07/18/18 0921  WBC 10.0  NEUTROABS 5,040  HGB 14.8  HCT 41.6  MCV 91.2  PLT 228   Lipid Panel: Recent Labs    11/29/17 0932 04/14/18 0920 07/18/18 0921  CHOL 152 142 184  HDL 40* 44 40  LDLCALC  --  57  --   TRIG 509* 394* 752*  CHOLHDL 3.8 3.2 4.6   TSH: No results for input(s): TSH in the last 8760 hours. A1C: Lab Results  Component Value Date   HGBA1C 8.1 (H) 07/18/2018     Assessment/Plan 1. Type 2 diabetes mellitus with hyperglycemia, without long-term current use of insulin (HCC) -uncontrolled on metformin and actos. Pt does not comply with dietary modifications and does not do routine exercise. Encouraged lifestyle modifications with diet and exercise (30 mins/5 days a week). Declines education on diet. Agreeable to add januvia at this time.  - sitaGLIPtin (JANUVIA) 100 MG tablet; Take 1 tablet (100 mg total) by mouth daily.  Dispense: 30 tablet; Refill: 3  2. Chronic gout without tophus, unspecified cause, unspecified site -no recent flares however uric acid level is elevated. He does not take  allopurinol daily. Educated on the need to keep uric acid <6 currently 8.3, recommended to take allopurinol 100 mg by mouth daily with low purine diet.   3. Hypertension associated with diabetes ([place]) Elevated, reports blood pressure stays elevated in office states home blood pressures are <140/90 however does not have reading today. Encouraged to record and bring back to office. Continues on lisinopril and norvasc  4. Hyperlipidemia associated with type 2 diabetes mellitus (HCC) Uncontrolled. Does not comply with diet- fried chicken prior to coming in to get triglycerides done. Educated to make dietary modifications and follow up lab in 6 weeks, pt declined wanting to do this. States "I will come back in my usual 3 month time" pt to continue crestor, does not wish to start any other medication due to cost (previously on lovaza). He is aware of risk with elevated triglycerides (ie pancreatitis and the complications that come with that)  Next appt: 3 months with lab work before visit  Jessica K. [place], [facility] [phone]

## 2018-07-24 NOTE — Patient Instructions (Addendum)
To take allopurinol daily to prevent gout flares and build up of uric acid in your system   Follow up in 3 months with fasting labs prior to appt   High Triglycerides Eating Plan Triglycerides are a type of fat in the blood. High levels of triglycerides can increase your risk of heart disease and stroke. If your triglyceride levels are high, choosing the right foods can help lower your triglycerides and keep your heart healthy. Work with your health care provider or a diet and nutrition specialist (dietitian) to develop an eating plan that is right for you. What are tips for following this plan? General guidelines   Lose weight, if you are overweight. For most people, losing 5-10 lbs (2-5 kg) helps lower triglyceride levels. A weight-loss plan may include. ? 30 minutes of exercise at least 5 days a week. ? Reducing the amount of calories, sugar, and fat you eat.  Eat a wide variety of fresh fruits, vegetables, and whole grains. These foods are high in fiber.  Eat foods that contain healthy fats, such as fatty fish, nuts, seeds, and olive oil.  Avoid foods that are high in added sugar, added salt (sodium), saturated fat, and trans fat.  Avoid low-fiber, refined carbohydrates such as white bread, crackers, noodles, and white rice.  Avoid foods with partially hydrogenated oils (trans fats), such as fried foods or stick margarine.  Limit alcohol intake to no more than 1 drink a day for nonpregnant women and 2 drinks a day for men. One drink equals 12 oz of beer, 5 oz of wine, or 1 oz of hard liquor. Your health care provider may recommend that you drink less depending on your overall health. Reading food labels  Check food labels for the amount of saturated fat. Choose foods with no or very little saturated fat.  Check food labels for the amount of trans fat. Choose foods with no trans fat.  Check food labels for the amount of cholesterol. Choose foods low in cholesterol. Ask your  dietitian how much cholesterol you should have each day.  Check food labels for the amount of sodium. Choose foods with less than 140 milligrams (mg) per serving. Shopping  Buy dairy products labeled as nonfat (skim) or low-fat (1%).  Avoid buying processed or prepackaged foods. These are often high in added sugar, sodium, and fat. Cooking  Choose healthy fats when cooking, such as olive oil or canola oil.  Cook foods using lower fat methods, such as baking, broiling, boiling, or grilling.  Make your own sauces, dressings, and marinades when possible, instead of buying them. Store-bought sauces, dressings, and marinades are often high in sodium and sugar. Meal planning  Eat more home-cooked food and less restaurant, buffet, and fast food.  Eat fatty fish at least 2 times each week. Examples of fatty fish include salmon, trout, mackerel, tuna, and herring.  If you eat whole eggs, do not eat more than 3 egg yolks per week. What foods are recommended? The items listed may not be a complete list. Talk with your dietitian about what dietary choices are best for you. Grains Whole wheat or whole grain breads, crackers, cereals, and pasta. Unsweetened oatmeal. Bulgur. Barley. Quinoa. Brown rice. Whole wheat flour tortillas. Vegetables Fresh or frozen vegetables. Low-sodium canned vegetables. Fruits All fresh, canned (in natural juice), or frozen fruits. Meats and other protein foods Skinless chicken or [place]. Ground chicken or [place]. Lean cuts of pork, trimmed of fat. Fish and seafood, especially salmon, trout,  and herring. Egg whites. Dried beans, peas, or lentils. Unsalted nuts or seeds. Unsalted canned beans. Natural peanut or almond butter. Dairy Low-fat dairy products. Skim or low-fat (1%) milk. Reduced fat (2%) and low-sodium cheese. Low-fat ricotta cheese. Low-fat cottage cheese. Plain, low-fat yogurt. Fats and oils Tub margarine without trans fats. Light or reduced-fat  mayonnaise. Light or reduced-fat salad dressings. Avocado. Safflower, olive, sunflower, soybean, and canola oils. What foods are not recommended? The items listed may not be a complete list. Talk with your dietitian about what dietary choices are best for you. Grains White bread. White (regular) pasta. White rice. Cornbread. Bagels. Pastries. Crackers that contain trans fat. Vegetables Creamed or fried vegetables. Vegetables in a cheese sauce. Fruits Sweetened dried fruit. Canned fruit in syrup. Fruit juice. Meats and other protein foods Fatty cuts of meat. Ribs. Chicken wings. [name]. Sausage. Bologna. Salami. Chitterlings. Fatback. Hot dogs. Bratwurst. Packaged lunch meats. Dairy Whole or reduced-fat (2%) milk. Half-and-half. Cream cheese. Full-fat or sweetened yogurt. Full-fat cheese. Nondairy creamers. Whipped toppings. Processed cheese or cheese spreads. Cheese curds. Beverages Alcohol. Sweetened drinks, such as soda, lemonade, fruit drinks, or punches. Fats and oils Butter. Stick margarine. Lard. Shortening. Ghee. Bacon fat. Tropical oils, such as coconut, palm kernel, or palm oils. Sweets and desserts Corn syrup. Sugars. Honey. Molasses. Candy. Jam and jelly. Syrup. Sweetened cereals. Cookies. Pies. Cakes. Donuts. Muffins. Ice cream. Condiments Store-bought sauces, dressings, and marinades that are high in sugar, such as ketchup and barbecue sauce. Summary  High levels of triglycerides can increase the risk of heart disease and stroke. Choosing the right foods can help lower your triglycerides.  Eat plenty of fresh fruits, vegetables, and whole grains. Choose low-fat dairy and lean meats. Eat fatty fish at least twice a week.  Avoid processed and prepackaged foods with added sugar, sodium, saturated fat, and trans fat.  If you need suggestions or have questions about what types of food are good for you, talk with your health care provider or a dietitian. This information is not  intended to replace advice given to you by your health care provider. Make sure you discuss any questions you have with your health care provider. Document Released: 03/18/2004 Document Revised: 08/03/2016 Document Reviewed: 08/03/2016 Elsevier Interactive Patient Education  2019 [name].

## 2018-09-18 ENCOUNTER — Other Ambulatory Visit: Payer: Self-pay | Admitting: Nurse Practitioner

## 2018-09-18 DIAGNOSIS — I10 Essential (primary) hypertension: Secondary | ICD-10-CM

## 2018-10-07 ENCOUNTER — Other Ambulatory Visit: Payer: Self-pay | Admitting: Nurse Practitioner

## 2018-10-07 DIAGNOSIS — E118 Type 2 diabetes mellitus with unspecified complications: Secondary | ICD-10-CM

## 2018-10-08 ENCOUNTER — Encounter: Payer: Self-pay | Admitting: Nurse Practitioner

## 2018-10-18 ENCOUNTER — Other Ambulatory Visit: Payer: Medicare Other

## 2018-10-18 ENCOUNTER — Other Ambulatory Visit: Payer: Self-pay | Admitting: Nurse Practitioner

## 2018-10-18 DIAGNOSIS — I10 Essential (primary) hypertension: Secondary | ICD-10-CM

## 2018-10-24 ENCOUNTER — Ambulatory Visit: Payer: Medicare Other | Admitting: Nurse Practitioner

## 2018-10-25 ENCOUNTER — Other Ambulatory Visit: Payer: Self-pay

## 2018-10-25 ENCOUNTER — Other Ambulatory Visit: Payer: Medicare Other

## 2018-10-26 LAB — LIPID PANEL
Cholesterol: 129 mg/dL (ref <200)
HDL: 48 mg/dL (ref >=40)
LDL Cholesterol (Calc): 52 mg/dL (calc)
Non-HDL Cholesterol (Calc): 81 mg/dL (calc) (ref <130)
Total CHOL/HDL Ratio: 2.7 (calc) (ref <5.0)
Triglycerides: 250 mg/dL — ABNORMAL HIGH (ref <150)

## 2018-10-26 LAB — COMPREHENSIVE METABOLIC PANEL
AG Ratio: 1.8 (calc) (ref 1.0–2.5)
ALT: 18 U/L (ref 9–46)
AST: 19 U/L (ref 10–35)
Albumin: 4.5 g/dL (ref 3.6–5.1)
Alkaline phosphatase (APISO): 64 U/L (ref 35–144)
BUN: 12 mg/dL (ref 7–25)
CO2: 27 mmol/L (ref 20–32)
Calcium: 9.6 mg/dL (ref 8.6–10.3)
Chloride: 102 mmol/L (ref 98–110)
Creat: 0.96 mg/dL (ref 0.70–1.25)
Globulin: 2.5 g/dL (calc) (ref 1.9–3.7)
Glucose, Bld: 191 mg/dL — ABNORMAL HIGH (ref 65–99)
Potassium: 4 mmol/L (ref 3.5–5.3)
Sodium: 139 mmol/L (ref 135–146)
Total Bilirubin: 0.7 mg/dL (ref 0.2–1.2)
Total Protein: 7 g/dL (ref 6.1–8.1)

## 2018-10-26 LAB — HEMOGLOBIN A1C
Hgb A1c MFr Bld: 8.2 % of total Hgb — ABNORMAL HIGH (ref <5.7)
Mean Plasma Glucose: 189 (calc)
eAG (mmol/L): 10.4 (calc)

## 2018-10-27 ENCOUNTER — Other Ambulatory Visit: Payer: Self-pay

## 2018-10-27 ENCOUNTER — Encounter: Payer: Self-pay | Admitting: Nurse Practitioner

## 2018-10-27 ENCOUNTER — Ambulatory Visit (INDEPENDENT_AMBULATORY_CARE_PROVIDER_SITE_OTHER): Payer: Medicare Other | Admitting: Nurse Practitioner

## 2018-10-27 DIAGNOSIS — I10 Essential (primary) hypertension: Secondary | ICD-10-CM | POA: Diagnosis not present

## 2018-10-27 DIAGNOSIS — M1A9XX0 Chronic gout, unspecified, without tophus (tophi): Secondary | ICD-10-CM | POA: Diagnosis not present

## 2018-10-27 DIAGNOSIS — E1169 Type 2 diabetes mellitus with other specified complication: Secondary | ICD-10-CM | POA: Diagnosis not present

## 2018-10-27 DIAGNOSIS — E119 Type 2 diabetes mellitus without complications: Secondary | ICD-10-CM

## 2018-10-27 DIAGNOSIS — E785 Hyperlipidemia, unspecified: Secondary | ICD-10-CM

## 2018-10-27 DIAGNOSIS — E1165 Type 2 diabetes mellitus with hyperglycemia: Secondary | ICD-10-CM

## 2018-10-27 DIAGNOSIS — Z86010 Personal history of colonic polyps: Secondary | ICD-10-CM

## 2018-10-27 MED ORDER — SITAGLIPTIN PHOSPHATE 100 MG PO TABS: 100.0000 mg | ORAL_TABLET | Freq: Every day | ORAL | 3 refills | Status: DC

## 2018-10-27 NOTE — Progress Notes (Signed)
Patient ID: Curtis Allison, male   DOB: Feb 23, 1951, 68 y.o.   MRN: [id number] This service is provided via telemedicine  No vital signs collected/recorded due to the encounter was a telemedicine visit.   Location of patient (ex: home, work):  HOME  Patient consents to a telephone visit:  YES  Location of the provider (ex: office, home):  OFFICE  Name of any referring provider:  Sherrie Mustache, NP  Names of all persons participating in the telemedicine service and their role in the encounter:  PATIENT, DESHANNON SMITH CMA, Sherrie Mustache NP  Time spent on call:  7:25     Careteam: Patient Care Team: Lauree Chandler, NP as PCP - General (Geriatric Medicine) Crista Luria, MD as Attending Physician (Dermatology) Joseph Art, OD (Optometry)  Advanced Directive information Does Patient Have a Medical Advance Directive?: Yes, Does patient want to make changes to medical advance directive?: No - Patient declined  Allergies  Allergen Reactions  . Penicillins Itching  . Jardiance [Empagliflozin] Itching    Increased urination/penile rash    Chief Complaint  Patient presents with  . Medical Management of Chronic Issues    30mh follow-up     HPI: Patient is a 68y.o. male for routine follow up.   DM- not controlled, taking metformin 500 mg 2 tablets twice daily, actos 30 mg daily. A1c 8.2. "metformin does not work"  Reports he was not taking januvia 100 mg - pharmacy "would not let me have it" states too costly.  Adamantly declines insulin.  Fasting blood sugar "maybe 150 maybe 160" Highest is 165.  Diet is "same as usual" "I know what I am suppose to do" "the thing is doing it"  Eating a lot of bread and potatoes  GOUT- uric acid level elevated but "not having trouble with gout" taking allopurinol 100 mg but not routinely. Will take three times a week. No recent flares.   Hyperlipidemia- eating bacon and foods high in sodium Taking crestor 10 mg by mouth daily    Hypertension- eating foods high in sodium "130 something*/80  Taking lisinopril and atenolol and Norvasc. Blood pressure always high in office.    Review of Systems:  Review of Systems  Constitutional: Negative for chills, fever and weight loss.  HENT: Negative for tinnitus.   Respiratory: Negative for cough, sputum production and shortness of breath.   Cardiovascular: Negative for chest pain, palpitations and leg swelling.  Gastrointestinal: Negative for abdominal pain, constipation, diarrhea and heartburn.  Genitourinary: Negative for dysuria, frequency and urgency.  Musculoskeletal: Negative for back pain, falls, joint pain and myalgias.  Skin: Negative.   Neurological: Negative for dizziness and headaches.  Psychiatric/Behavioral: Negative for depression and memory loss. The patient does not have insomnia.     Past Medical History:  Diagnosis Date  . Arthritis   . Diabetes mellitus ([place]    type II ; diagnosed 2006  . Gout   . Hyperlipidemia   . Hypertension    diagnosed 2006  . Inguinal hernia    Past Surgical History:  Procedure Laterality Date  . INGUINAL HERNIA REPAIR Right 04/01/2014   Procedure: LAPAROSCOPIC RIGHT  INGUINAL HERNIA REPAIR WITH MESH;  Surgeon: ARalene Ok MD;  Location: [facility];  Service: General;  Laterality: Right;   Social History:   reports that he quit smoking about 9 years ago. His smoking use included cigarettes. He has a 40.00 pack-year smoking history. He has never used smokeless tobacco. He reports current alcohol use of about  3.0 standard drinks of alcohol per week. He reports that he does not use drugs.  Family History  Problem Relation Age of Onset  . Diabetes Mother   . Hypertension Mother   . Lung cancer Mother   . Diabetes Sister   . Hypertension Sister   . Heart disease Father   . Diabetes Father   . Hypertension Father   . Colon cancer Maternal Grandfather     Medications: Patient's Medications  New Prescriptions    No medications on file  Previous Medications   ACCU-CHEK FASTCLIX LANCETS MISC    1 each by Other route daily. Dx: E11.65   ALLOPURINOL (ZYLOPRIM) 100 MG TABLET    Take 2 tablets (200 mg total) by mouth daily.   AMLODIPINE (NORVASC) 10 MG TABLET    TAKE ONE TABLET BY MOUTH EVERY EVENING   ASPIRIN EC 81 MG TABLET    Take 1 tablet (81 mg total) by mouth daily.   ATENOLOL (TENORMIN) 50 MG TABLET    TAKE ONE TABLET BY MOUTH EVERY MORNING   GLUCOSE BLOOD (ACCU-CHEK GUIDE) TEST STRIP    1 each by Other route daily. Dx: E11.65   HYDROCORTISONE CREAM 1 %    Apply 1 application topically once as needed for itching.   LISINOPRIL (PRINIVIL,ZESTRIL) 40 MG TABLET    TAKE ONE TABLET BY MOUTH EVERY MORNING   METFORMIN (GLUCOPHAGE-XR) 500 MG 24 HR TABLET    Take 2 tablets by mouth 2 (two) times a day.   NAPROXEN SODIUM (ALEVE) 220 MG TABLET    Take 220 mg daily as needed by mouth.   PIOGLITAZONE (ACTOS) 30 MG TABLET    TAKE ONE TABLET BY MOUTH DAILY   ROSUVASTATIN (CRESTOR) 10 MG TABLET    TAKE ONE TABLET BY MOUTH DAILY   SITAGLIPTIN (JANUVIA) 100 MG TABLET    Take 1 tablet (100 mg total) by mouth daily.   TETRAHYDROZOLINE HCL (VISINE EXTRA OP)    Place 2 drops into both eyes once as needed (dry eyes.).  Modified Medications   No medications on file  Discontinued Medications   BLOOD GLUCOSE MONITORING SUPPL (ACCU-CHEK GUIDE) W/DEVICE KIT    1 each by Other route daily. Dx: E11.65   COLCHICINE 0.6 MG TABLET    Take 0.6 mg by mouth. Take two tablets, Then one tablet every hour for flare ups.   LANCETS MISC. (ACCU-CHEK FASTCLIX LANCET) KIT    1 each by Other route daily. Dx:E11.65   METFORMIN (GLUMETZA) 1000 MG (MOD) 24 HR TABLET    Take 1 tablet (1,000 mg total) by mouth 2 (two) times daily.     Physical Exam: unable due to televisit.    Labs reviewed: Basic Metabolic Panel: Recent Labs    04/14/18 0920 07/18/18 0921 10/25/18 0850  NA 140 140 139  K 3.7 3.7 4.0  CL 103 102 102  CO2 '23 25 27   ' GLUCOSE 171* 149* 191*  BUN '14 12 12  ' CREATININE 1.16 1.08 0.96  CALCIUM 9.2 9.7 9.6   Liver Function Tests: Recent Labs    04/14/18 0920 07/18/18 0921 10/25/18 0850  AST '17 18 19  ' ALT '15 18 18  ' BILITOT 0.7 0.5 0.7  PROT 6.8 6.9 7.0   No results for input(s): LIPASE, AMYLASE in the last 8760 hours. No results for input(s): AMMONIA in the last 8760 hours. CBC: Recent Labs    07/18/18 0921  WBC 10.0  NEUTROABS 5,040  HGB 14.8  HCT 41.6  MCV  91.2  PLT 228   Lipid Panel: Recent Labs    04/14/18 0920 07/18/18 0921 10/25/18 0850  CHOL 142 184 129  HDL 44 40 48  LDLCALC 57  --  52  TRIG 394* 752* 250*  CHOLHDL 3.2 4.6 2.7   TSH: No results for input(s): TSH in the last 8760 hours. A1C: Lab Results  Component Value Date   HGBA1C 8.2 (H) 10/25/2018     Assessment/Plan 1. Essential hypertension Stable, pt reports blood pressure is well controlled at home and elevated in office. Appears to have variation based on diet. Pt admits to poor diet high in sodium. Continues on lisinopril 40 mg by mouth daily and  norvasc 10 mg    2. Hyperlipidemia associated with type 2 diabetes mellitus (HCC) Improved triglycerides despite ongoing elevation in glucose. LDL at goal. Continues on Crestor.  3. Chronic gout without tophus, unspecified cause, unspecified site Does not take allopurinol routinely. No recent flares despite elevated uric acid level. Encouraged diet modifications with compliance in medication.   4. Type 2 diabetes mellitus without complication, without long-term current use of insulin ([place]) -januvia sent to pharmacy again, reports he never picked [place] first he states it was too expensive but then states they never gave it to him. He never notified the office that he did not start [place]. Blood sugars remain elevated. States he knows what dietary modifications he needs to make but has not. Declines dietary referral. Declines endocrine referral.  -education  provided in detail about long term effects of uncontrolled blood sugars have on the body such as diabetic retinopathy, CKD healing to HD, neopathy and risk for nonhealing wounds, risk for MI and stroke.  Encouraged dietary compliance, routine foot care/monitoring and to keep up with diabetic eye exams through ophthalmology   5. Personal history of adenomatous colonic polyp Declines follow up at this time, states he knows who to call.   Next appt: 3 months with lab work before.  Carlos American. Harle Battiest  [facility] [phone]    Virtual Visit via Telephone Note  I connected with pt on 10/27/18 at  2:15 PM EDT by telephone and verified that I am speaking with the correct person using two identifiers.  Location: Patient: home  Provider: office    I discussed the limitations, risks, security and privacy concerns of performing an evaluation and management service by telephone and the availability of in person appointments. I also discussed with the patient that there may be a patient responsible charge related to this service. The patient expressed understanding and agreed to proceed.   I discussed the assessment and treatment plan with the patient. The patient was provided an opportunity to ask questions and all were answered. The patient agreed with the plan and demonstrated an understanding of the instructions.   The patient was advised to call back or seek an in-person evaluation if the symptoms worsen or if the condition fails to improve as anticipated.  I provided 25 minutes of non-face-to-face time during this encounter.  Carlos American. Harle Battiest Avs printed and mailed

## 2018-10-27 NOTE — Patient Instructions (Addendum)
Januvia resent to pharmacy- please pick this up and ADD to metformin  and actos  Important to be mindful of diet and to make changes in diet for optimal control of diabetes  Follow up in 3 months with lab work before visit  Please feel free to call before visit if needed    [place] stands for "Dietary Approaches to Stop Hypertension." The DASH eating plan is a healthy eating plan that has been shown to reduce high blood pressure (hypertension). It may also reduce your risk for type 2 diabetes, heart disease, and stroke. The DASH eating plan may also help with weight loss. What are tips for following this plan?  General guidelines  Avoid eating more than 2,300 mg (milligrams) of salt (sodium) a day. If you have hypertension, you may need to reduce your sodium intake to 1,500 mg a day.  Limit alcohol intake to no more than 1 drink a day for nonpregnant women and 2 drinks a day for men. One drink equals 12 oz of beer, 5 oz of wine, or 1 oz of hard liquor.  Work with your health care provider to maintain a healthy body weight or to lose weight. Ask what an ideal weight is for you.  Get at least 30 minutes of exercise that causes your heart to beat faster (aerobic exercise) most days of the week. Activities may include walking, swimming, or biking.  Work with your health care provider or diet and nutrition specialist (dietitian) to adjust your eating plan to your individual calorie needs. Reading food labels   Check food labels for the amount of sodium per serving. Choose foods with less than 5 percent of the Daily Value of sodium. Generally, foods with less than 300 mg of sodium per serving fit into this eating plan.  To find whole grains, look for the word "whole" as the first word in the ingredient list. Shopping  Buy products labeled as "low-sodium" or "no salt added."  Buy fresh foods. Avoid canned foods and premade or frozen meals. Cooking  Avoid adding salt  when cooking. Use salt-free seasonings or herbs instead of table salt or sea salt. Check with your health care provider or pharmacist before using salt substitutes.  Do not fry foods. Cook foods using healthy methods such as baking, boiling, grilling, and broiling instead.  Cook with heart-healthy oils, such as olive, canola, soybean, or sunflower oil. Meal planning  Eat a balanced diet that includes: ? 5 or more servings of fruits and vegetables each day. At each meal, try to fill half of your plate with fruits and vegetables. ? Up to 6-8 servings of whole grains each day. ? Less than 6 oz of lean meat, poultry, or fish each day. A 3-oz serving of meat is about the same size as a deck of cards. One egg equals 1 oz. ? 2 servings of low-fat dairy each day. ? A serving of nuts, seeds, or beans 5 times each week. ? Heart-healthy fats. Healthy fats called Omega-3 fatty acids are found in foods such as flaxseeds and coldwater fish, like sardines, salmon, and mackerel.  Limit how much you eat of the following: ? Canned or prepackaged foods. ? Food that is high in trans fat, such as fried foods. ? Food that is high in saturated fat, such as fatty meat. ? Sweets, desserts, sugary drinks, and other foods with added sugar. ? Full-fat dairy products.  Do not salt foods before eating.  Try to eat  at least 2 vegetarian meals each week.  Eat more home-cooked food and less restaurant, buffet, and fast food.  When eating at a restaurant, ask that your food be prepared with less salt or no salt, if possible. What foods are recommended? The items listed may not be a complete list. Talk with your dietitian about what dietary choices are best for you. Grains Whole-grain or whole-wheat bread. Whole-grain or whole-wheat pasta. Brown rice. [place]. Bulgur. Whole-grain and low-sodium cereals. Pita bread. Low-fat, low-sodium crackers. Whole-wheat flour tortillas. Vegetables Fresh or frozen  vegetables (raw, steamed, roasted, or grilled). Low-sodium or reduced-sodium tomato and vegetable juice. Low-sodium or reduced-sodium tomato sauce and tomato paste. Low-sodium or reduced-sodium canned vegetables. Fruits All fresh, dried, or frozen fruit. Canned fruit in natural juice (without added sugar). Meat and other protein foods Skinless chicken or [place]. Ground chicken or [place]. Pork with fat trimmed off. Fish and seafood. Egg whites. Dried beans, peas, or lentils. Unsalted nuts, nut butters, and seeds. Unsalted canned beans. Lean cuts of beef with fat trimmed off. Low-sodium, lean deli meat. Dairy Low-fat (1%) or fat-free (skim) milk. Fat-free, low-fat, or reduced-fat cheeses. Nonfat, low-sodium ricotta or cottage cheese. Low-fat or nonfat yogurt. Low-fat, low-sodium cheese. Fats and oils Soft margarine without trans fats. Vegetable oil. Low-fat, reduced-fat, or light mayonnaise and salad dressings (reduced-sodium). Canola, safflower, olive, soybean, and sunflower oils. Avocado. Seasoning and other foods Herbs. Spices. Seasoning mixes without salt. Unsalted popcorn and pretzels. Fat-free sweets. What foods are not recommended? The items listed may not be a complete list. Talk with your dietitian about what dietary choices are best for you. Grains Baked goods made with fat, such as croissants, muffins, or some breads. Dry pasta or rice meal packs. Vegetables Creamed or fried vegetables. Vegetables in a cheese sauce. Regular canned vegetables (not low-sodium or reduced-sodium). Regular canned tomato sauce and paste (not low-sodium or reduced-sodium). Regular tomato and vegetable juice (not low-sodium or reduced-sodium). [name]. Olives. Fruits Canned fruit in a light or heavy syrup. Fried fruit. Fruit in cream or butter sauce. Meat and other protein foods Fatty cuts of meat. Ribs. Fried meat. [name]. Sausage. Bologna and other processed lunch meats. Salami. Fatback. Hotdogs. Bratwurst.  Salted nuts and seeds. Canned beans with added salt. Canned or smoked fish. Whole eggs or egg yolks. Chicken or [place] with skin. Dairy Whole or 2% milk, cream, and half-and-half. Whole or full-fat cream cheese. Whole-fat or sweetened yogurt. Full-fat cheese. Nondairy creamers. Whipped toppings. Processed cheese and cheese spreads. Fats and oils Butter. Stick margarine. Lard. Shortening. Ghee. Bacon fat. Tropical oils, such as coconut, palm kernel, or palm oil. Seasoning and other foods Salted popcorn and pretzels. Onion salt, garlic salt, seasoned salt, table salt, and sea salt. Worcestershire sauce. Tartar sauce. Barbecue sauce. Teriyaki sauce. Soy sauce, including reduced-sodium. Steak sauce. Canned and packaged gravies. Fish sauce. Oyster sauce. Cocktail sauce. Horseradish that you find on the shelf. Ketchup. Mustard. Meat flavorings and tenderizers. Bouillon cubes. Hot sauce and Tabasco sauce. Premade or packaged marinades. Premade or packaged taco seasonings. Relishes. Regular salad dressings. Where to find more information:  National Heart, Lung, and [place]: https://wilson-eaton.com/  American Heart Association: www.heart.org Summary  The DASH eating plan is a healthy eating plan that has been shown to reduce high blood pressure (hypertension). It may also reduce your risk for type 2 diabetes, heart disease, and stroke.  With the DASH eating plan, you should limit salt (sodium) intake to 2,300 mg a day. If you have hypertension,  you may need to reduce your sodium intake to 1,500 mg a day.  When on the DASH eating plan, aim to eat more fresh fruits and vegetables, whole grains, lean proteins, low-fat dairy, and heart-healthy fats.  Work with your health care provider or diet and nutrition specialist (dietitian) to adjust your eating plan to your individual calorie needs. This information is not intended to replace advice given to you by your health care provider. Make sure you discuss any  questions you have with your health care provider. Document Released: 05/20/2011 Document Revised: 05/24/2016 Document Reviewed: 05/24/2016 Elsevier Interactive Patient Education  2019 [place].    Gout  Gout is painful swelling of your joints. Gout is a type of arthritis. It is caused by having too much uric acid in your body. Uric acid is a chemical that is made when your body breaks down substances called purines. If your body has too much uric acid, sharp crystals can form and build up in your joints. This causes pain and swelling. Gout attacks can happen quickly and be very painful (acute gout). Over time, the attacks can affect more joints and happen more often (chronic gout). What are the causes?  Too much uric acid in your blood. This can happen because: ? Your kidneys do not remove enough uric acid from your blood. ? Your body makes too much uric acid. ? You eat too many foods that are high in purines. These foods include organ meats, some seafood, and beer.  Trauma or stress. What increases the risk?  Having a family history of gout.  Being male and middle-aged.  Being male and having gone through menopause.  Being very overweight (obese).  Drinking alcohol, especially beer.  Not having enough water in the body (being dehydrated).  Losing weight too quickly.  Having an organ transplant.  Having lead poisoning.  Taking certain medicines.  Having kidney disease.  Having a skin condition called psoriasis. What are the signs or symptoms? An attack of acute gout usually happens in just one joint. The most common place is the big toe. Attacks often start at night. Other joints that may be affected include joints of the feet, ankle, knee, fingers, wrist, or elbow. Symptoms of an attack may include:  Very bad pain.  Warmth.  Swelling.  Stiffness.  Shiny, red, or purple skin.  Tenderness. The affected joint may be very painful to touch.  Chills and  fever. Chronic gout may cause symptoms more often. More joints may be involved. You may also have white or yellow lumps (tophi) on your hands or feet or in other areas near your joints. How is this treated?  Treatment for this condition has two phases: treating an acute attack and preventing future attacks.  Acute gout treatment may include: ? NSAIDs. ? Steroids. These are taken by mouth or injected into a joint. ? Colchicine. This medicine relieves pain and swelling. It can be given by mouth or through an IV tube.  Preventive treatment may include: ? Taking small doses of NSAIDs or colchicine daily. ? Using a medicine that reduces uric acid levels in your blood. ? Making changes to your diet. You may need to see a food expert (dietitian) about what to eat and drink to prevent gout. Follow these instructions at home: During a gout attack   If told, put ice on the painful area: ? Put ice in a plastic bag. ? Place a towel between your skin and the bag. ? Leave the  ice on for 20 minutes, 2-3 times a day.  Raise (elevate) the painful joint above the level of your heart as often as you can.  Rest the joint as much as possible. If the joint is in your leg, you may be given crutches.  Follow instructions from your doctor about what you cannot eat or drink. Avoiding future gout attacks  Eat a low-purine diet. Avoid foods and drinks such as: ? Liver. ? Kidney. ? Anchovies. ? Asparagus. ? Herring. ? Mushrooms. ? Mussels. ? Beer.  Stay at a healthy weight. If you want to lose weight, talk with your doctor. Do not lose weight too fast.  Start or continue an exercise plan as told by your doctor. Eating and drinking  Drink enough fluids to keep your pee (urine) pale yellow.  If you drink alcohol: ? Limit how much you use to:  0-1 drink a day for women.  0-2 drinks a day for men. ? Be aware of how much alcohol is in your drink. In the [place] one drink equals one 12 oz bottle of  beer (355 mL), one 5 oz glass of wine (148 mL), or one 1 oz glass of hard liquor (44 mL). General instructions  Take over-the-counter and prescription medicines only as told by your doctor.  Do not drive or use heavy machinery while taking prescription pain medicine.  Return to your normal activities as told by your doctor. Ask your doctor what activities are safe for you.  Keep all follow-up visits as told by your doctor. This is important. Contact a doctor if:  You have another gout attack.  You still have symptoms of a gout attack after 10 days of treatment.  You have problems (side effects) because of your medicines.  You have chills or a fever.  You have burning pain when you pee (urinate).  You have pain in your lower back or belly. Get help right away if:  You have very bad pain.  Your pain cannot be controlled.  You cannot pee. Summary  Gout is painful swelling of the joints.  The most common site of pain is the big toe, but it can affect other joints.  Medicines and avoiding some foods can help to prevent and treat gout attacks. This information is not intended to replace advice given to you by your health care provider. Make sure you discuss any questions you have with your health care provider. Document Released: 03/09/2008 Document Revised: 12/21/2017 Document Reviewed: 12/21/2017 Elsevier Interactive Patient Education  2019 [place].    Diabetes Mellitus and Nutrition, Adult When you have diabetes (diabetes mellitus), it is very important to have healthy eating habits because your blood sugar (glucose) levels are greatly affected by what you eat and drink. Eating healthy foods in the appropriate amounts, at about the same times every day, can help you:  Control your blood glucose.  Lower your risk of heart disease.  Improve your blood pressure.  Reach or maintain a healthy weight. Every person with diabetes is different, and each person has  different needs for a meal plan. Your health care provider may recommend that you work with a diet and nutrition specialist (dietitian) to make a meal plan that is best for you. Your meal plan may vary depending on factors such as:  The calories you need.  The medicines you take.  Your weight.  Your blood glucose, blood pressure, and cholesterol levels.  Your activity level.  Other health conditions you have, such  as heart or kidney disease. How do carbohydrates affect me? Carbohydrates, also called carbs, affect your blood glucose level more than any other type of food. Eating carbs naturally raises the amount of glucose in your blood. Carb counting is a method for keeping track of how many carbs you eat. Counting carbs is important to keep your blood glucose at a healthy level, especially if you use insulin or take certain oral diabetes medicines. It is important to know how many carbs you can safely have in each meal. This is different for every person. Your dietitian can help you calculate how many carbs you should have at each meal and for each snack. Foods that contain carbs include:  Bread, cereal, rice, pasta, and crackers.  Potatoes and corn.  Peas, beans, and lentils.  Milk and yogurt.  Fruit and juice.  Desserts, such as cakes, cookies, ice cream, and candy. How does alcohol affect me? Alcohol can cause a sudden decrease in blood glucose (hypoglycemia), especially if you use insulin or take certain oral diabetes medicines. Hypoglycemia can be a life-threatening condition. Symptoms of hypoglycemia (sleepiness, dizziness, and confusion) are similar to symptoms of having too much alcohol. If your health care provider says that alcohol is safe for you, follow these guidelines:  Limit alcohol intake to no more than 1 drink per day for nonpregnant women and 2 drinks per day for men. One drink equals 12 oz of beer, 5 oz of wine, or 1 oz of hard liquor.  Do not drink on an  empty stomach.  Keep yourself hydrated with water, diet soda, or unsweetened iced tea.  Keep in mind that regular soda, juice, and other mixers may contain a lot of sugar and must be counted as carbs. What are tips for following this plan?  Reading food labels  Start by checking the serving size on the "Nutrition Facts" label of packaged foods and drinks. The amount of calories, carbs, fats, and other nutrients listed on the label is based on one serving of the item. Many items contain more than one serving per package.  Check the total grams (g) of carbs in one serving. You can calculate the number of servings of carbs in one serving by dividing the total carbs by 15. For example, if a food has 30 g of total carbs, it would be equal to 2 servings of carbs.  Check the number of grams (g) of saturated and trans fats in one serving. Choose foods that have low or no amount of these fats.  Check the number of milligrams (mg) of salt (sodium) in one serving. Most people should limit total sodium intake to less than 2,300 mg per day.  Always check the nutrition information of foods labeled as "low-fat" or "nonfat". These foods may be higher in added sugar or refined carbs and should be avoided.  Talk to your dietitian to identify your daily goals for nutrients listed on the label. Shopping  Avoid buying canned, premade, or processed foods. These foods tend to be high in fat, sodium, and added sugar.  Shop around the outside edge of the grocery store. This includes fresh fruits and vegetables, bulk grains, fresh meats, and fresh dairy. Cooking  Use low-heat cooking methods, such as baking, instead of high-heat cooking methods like deep frying.  Cook using healthy oils, such as olive, canola, or sunflower oil.  Avoid cooking with butter, cream, or high-fat meats. Meal planning  Eat meals and snacks regularly, preferably at the same times  every day. Avoid going long periods of time without  eating.  Eat foods high in fiber, such as fresh fruits, vegetables, beans, and whole grains. Talk to your dietitian about how many servings of carbs you can eat at each meal.  Eat 4-6 ounces (oz) of lean protein each day, such as lean meat, chicken, fish, eggs, or tofu. One oz of lean protein is equal to: ? 1 oz of meat, chicken, or fish. ? 1 egg. ?  cup of tofu.  Eat some foods each day that contain healthy fats, such as avocado, nuts, seeds, and fish. Lifestyle  Check your blood glucose regularly.  Exercise regularly as told by your health care provider. This may include: ? 150 minutes of moderate-intensity or vigorous-intensity exercise each week. This could be brisk walking, biking, or water aerobics. ? Stretching and doing strength exercises, such as yoga or weightlifting, at least 2 times a week.  Take medicines as told by your health care provider.  Do not use any products that contain nicotine or tobacco, such as cigarettes and e-cigarettes. If you need help quitting, ask your health care provider.  Work with a Social worker or diabetes educator to identify strategies to manage stress and any emotional and social challenges. Questions to ask a health care provider  Do I need to meet with a diabetes educator?  Do I need to meet with a dietitian?  What number can I call if I have questions?  When are the best times to check my blood glucose? Where to find more information:  American Diabetes Association: diabetes.org  Academy of Nutrition and Dietetics: www.eatright.[employer] of Diabetes and Digestive and Kidney Diseases (NIH): [employer] Summary  A healthy meal plan will help you control your blood glucose and maintain a healthy lifestyle.  Working with a diet and nutrition specialist (dietitian) can help you make a meal plan that is best for you.  Keep in mind that carbohydrates (carbs) and alcohol have immediate effects on your blood glucose  levels. It is important to count carbs and to use alcohol carefully. This information is not intended to replace advice given to you by your health care provider. Make sure you discuss any questions you have with your health care provider. Document Released: 02/25/2005 Document Revised: 12/29/2016 Document Reviewed: 07/05/2016 Elsevier Interactive Patient Education  2019 [name].

## 2018-12-16 ENCOUNTER — Other Ambulatory Visit: Payer: Self-pay | Admitting: Nurse Practitioner

## 2018-12-16 DIAGNOSIS — I10 Essential (primary) hypertension: Secondary | ICD-10-CM

## 2018-12-21 ENCOUNTER — Other Ambulatory Visit: Payer: Self-pay | Admitting: Nurse Practitioner

## 2018-12-21 DIAGNOSIS — I10 Essential (primary) hypertension: Secondary | ICD-10-CM

## 2018-12-21 DIAGNOSIS — E118 Type 2 diabetes mellitus with unspecified complications: Secondary | ICD-10-CM

## 2019-01-03 ENCOUNTER — Other Ambulatory Visit: Payer: Self-pay | Admitting: Nurse Practitioner

## 2019-01-21 ENCOUNTER — Other Ambulatory Visit: Payer: Self-pay | Admitting: Nurse Practitioner

## 2019-01-21 DIAGNOSIS — I10 Essential (primary) hypertension: Secondary | ICD-10-CM

## 2019-01-22 ENCOUNTER — Other Ambulatory Visit: Payer: Self-pay

## 2019-01-22 ENCOUNTER — Ambulatory Visit: Payer: Self-pay

## 2019-01-22 ENCOUNTER — Ambulatory Visit (INDEPENDENT_AMBULATORY_CARE_PROVIDER_SITE_OTHER): Payer: Medicare Other | Admitting: Nurse Practitioner

## 2019-01-22 ENCOUNTER — Encounter: Payer: Self-pay | Admitting: Nurse Practitioner

## 2019-01-22 VITALS — BP 156/80 | HR 101 | Temp 98.1°F | Ht 70.0 in | Wt 243.0 lb

## 2019-01-22 DIAGNOSIS — Z0000 Encounter for general adult medical examination without abnormal findings: Secondary | ICD-10-CM | POA: Diagnosis not present

## 2019-01-22 NOTE — Progress Notes (Signed)
Subjective:   Curtis Allison is a 68 y.o. male who presents for Medicare Annual/Subsequent preventive examination.  Review of Systems:   Cardiac Risk Factors include: advanced age (>58men, >65 women);dyslipidemia;hypertension;diabetes mellitus;obesity (BMI >30kg/m2);male gender     Objective:    Vitals: BP (!) 156/80   Pulse (!) 101   Temp 98.1 F (36.7 C) (Oral)   Ht 5\' 10"  (1.778 m)   Wt 243 lb (110.2 kg)   SpO2 98%   BMI 34.87 kg/m   Body mass index is 34.87 kg/m.  Advanced Directives 01/22/2019 10/27/2018 10/27/2018 07/24/2018 04/20/2018 01/18/2018 11/25/2017  Does Patient Have a Medical Advance Directive? No - Yes No No No No  Does patient want to make changes to medical advance directive? - No - Patient declined No - Patient declined - - - -  Would patient like information on creating a medical advance directive? No - Patient declined - - No - Patient declined - Yes (MAU/Ambulatory/Procedural Areas - Information given) -    Tobacco Social History   Tobacco Use  Smoking Status Former Smoker  . Packs/day: 1.00  . Years: 40.00  . Pack years: 40.00  . Types: Cigarettes  . Quit date: 01/04/2009  . Years since quitting: 10.0  Smokeless Tobacco Never Used     Counseling given: Not Answered   Clinical Intake:  Pre-visit preparation completed: Yes  Pain : No/denies pain     BMI - recorded: 34.87 Nutritional Status: BMI > 30  Obese Nutritional Risks: None Diabetes: Yes CBG done?: No Did pt. bring in CBG monitor from home?: No  How often do you need to have someone help you when you read instructions, pamphlets, or other written materials from your doctor or pharmacy?: 1 - Never What is the last grade level you completed in school?: 12th grade  Interpreter Needed?: No     Past Medical History:  Diagnosis Date  . Arthritis   . Diabetes mellitus ([place])    type II ; diagnosed 2006  . Gout   . Hyperlipidemia   . Hypertension    diagnosed 2006  . Inguinal  hernia    Past Surgical History:  Procedure Laterality Date  . INGUINAL HERNIA REPAIR Right 04/01/2014   Procedure: LAPAROSCOPIC RIGHT  INGUINAL HERNIA REPAIR WITH MESH;  Surgeon: Ralene Ok, MD;  Location: [facility];  Service: General;  Laterality: Right;   Family History  Problem Relation Age of Onset  . Diabetes Mother   . Hypertension Mother   . Lung cancer Mother   . Diabetes Sister   . Hypertension Sister   . Heart disease Father   . Diabetes Father   . Hypertension Father   . Colon cancer Maternal Grandfather    Social History   Socioeconomic History  . Marital status: Legally Separated    Spouse name: Not on file  . Number of children: Not on file  . Years of education: Not on file  . Highest education level: Not on file  Occupational History  . Not on file  Social Needs  . Financial resource strain: Not hard at all  . Food insecurity    Worry: Never true    Inability: Never true  . Transportation needs    Medical: No    Non-medical: No  Tobacco Use  . Smoking status: Former Smoker    Packs/day: 1.00    Years: 40.00    Pack years: 40.00    Types: Cigarettes    Quit date: 01/04/2009  Years since quitting: 10.0  . Smokeless tobacco: Never Used  Substance and Sexual Activity  . Alcohol use: Yes    Alcohol/week: 3.0 standard drinks    Types: 3 Shots of liquor per week  . Drug use: No    Types: Marijuana    Comment: Used back in the 70's   . Sexual activity: Not Currently  Lifestyle  . Physical activity    Days per week: 0 days    Minutes per session: 0 min  . Stress: Only a little  Relationships  . Social Herbalist on phone: Three times a week    Gets together: Once a week    Attends religious service: More than 4 times per year    Active member of club or organization: No    Attends meetings of clubs or organizations: Never    Relationship status: Separated  Other Topics Concern  . Not on file  Social History Narrative   Social  History      Diet? 40      Do you drink/eat things with caffeine? coffee      Marital status?                  separated                  What year were you married? 1997      Do you live in a house, apartment, assisted living, condo, trailer, etc.? House, apartment, trailer      Is it one or more stories? no      How many persons live in your home? 2      Do you have any pets in your home? (please list) no      Highest level of education completed? 12      Current or past profession: sanitation, [employer]      Do you exercise?                 yes                     Type & how often? Twice a week-sit ups      Advanced Directives      Do you have a living will? no      Do you have a DNR form?                                  If not, do you want to discuss one? no      Do you have signed POA/HPOA for forms? no      Functional Status      Do you have difficulty bathing or dressing yourself? no      Do you have difficulty preparing food or eating? no      Do you have difficulty managing your medications? no      Do you have difficulty managing your finances? no      Do you have difficulty affording your medications? no    Outpatient Encounter Medications as of 01/22/2019  Medication Sig  . ACCU-CHEK FASTCLIX LANCETS MISC 1 each by Other route daily. Dx: E11.65  . allopurinol (ZYLOPRIM) 100 MG tablet Take 2 tablets (200 mg total) by mouth daily.  Marland Kitchen amLODipine (NORVASC) 10 MG tablet TAKE ONE TABLET BY MOUTH EVERY EVENING  . aspirin EC 81 MG tablet Take 1 tablet (  81 mg total) by mouth daily.  Marland Kitchen atenolol (TENORMIN) 50 MG tablet TAKE ONE TABLET BY MOUTH EVERY MORNING  . glucose blood (ACCU-CHEK GUIDE) test strip 1 each by Other route daily. Dx: E11.65  . hydrocortisone cream 1 % Apply 1 application topically once as needed for itching.  Marland Kitchen lisinopril (ZESTRIL) 40 MG tablet TAKE ONE TABLET BY MOUTH EVERY MORNING  . metFORMIN (GLUCOPHAGE-XR) 500 MG 24 hr tablet Take 2  tablets (1,000 mg total) by mouth 2 (two) times daily. E11.9  . naproxen sodium (ALEVE) 220 MG tablet Take 220 mg daily as needed by mouth.  . pioglitazone (ACTOS) 30 MG tablet TAKE ONE TABLET BY MOUTH DAILY  . rosuvastatin (CRESTOR) 10 MG tablet TAKE ONE TABLET BY MOUTH DAILY  . Tetrahydrozoline HCl (VISINE EXTRA OP) Place 2 drops into both eyes once as needed (dry eyes.).  . [DISCONTINUED] sitaGLIPtin (JANUVIA) 100 MG tablet Take 1 tablet (100 mg total) by mouth daily. (Patient not taking: Reported on 01/22/2019)   No facility-administered encounter medications on file as of 01/22/2019.     Activities of Daily Living In your present state of health, do you have any difficulty performing the following activities: 01/22/2019  Hearing? N  Vision? N  Difficulty concentrating or making decisions? N  Walking or climbing stairs? N  Dressing or bathing? N  Doing errands, shopping? N  Preparing Food and eating ? N  Using the Toilet? N  In the past six months, have you accidently leaked urine? N  Do you have problems with loss of bowel control? N  Managing your Medications? N  Managing your Finances? N  Housekeeping or managing your Housekeeping? N  Some recent data might be hidden    Patient Care Team: Lauree Chandler, NP as PCP - General (Geriatric Medicine) Crista Luria, MD as Attending Physician (Dermatology) Joseph Art, OD (Optometry)   Assessment:   This is a routine wellness examination for [place].  Exercise Activities and Dietary recommendations Current Exercise Habits: Home exercise routine, Type of exercise: stretching, Time (Minutes): 30, Frequency (Times/Week): 5, Weekly Exercise (Minutes/Week): 150, Intensity: Moderate  Goals    . diabetes (pt-stated)     Working on getting diabetes under better control with portion control and decrease in sugar       Fall Risk Fall Risk  01/22/2019 10/27/2018 07/24/2018 04/20/2018 01/18/2018  Falls in the past year? 0 0 0 0 No   Number falls in past yr: 0 0 0 - -  Injury with Fall? 0 0 0 - -   Is the patient's home free of loose throw rugs in walkways, pet beds, electrical cords, etc?   no      Grab bars in the bathroom? no      Handrails on the stairs?   no      Adequate lighting?   yes  Timed Get Up and Go Performed: na  Depression Screen PHQ 2/9 Scores 01/22/2019 10/27/2018 01/18/2018 06/15/2017  PHQ - 2 Score 0 0 0 0    Cognitive Function MMSE - Mini Mental State Exam 01/22/2019 06/15/2017  Orientation to time 5 4  Orientation to Place 4 5  Registration 3 3  Attention/ Calculation 5 5  Recall 2 2  Language- name 2 objects 2 2  Language- repeat 1 1  Language- follow 3 step command 3 3  Language- read & follow direction 1 1  Write a sentence 1 1  Copy design 1 0  Total score 28 27  There is no immunization history on file for this patient.  Qualifies for Shingles Vaccine? yes  Screening Tests Health Maintenance  Topic Date Due  . Hepatitis C Screening  11-06-1950  . COLONOSCOPY  01/20/2017  . FOOT EXAM  11/26/2018  . INFLUENZA VACCINE  01/13/2019  . TETANUS/TDAP  07/25/2019 (Originally 02/05/1970)  . PNA vac Low Risk Adult (1 of 2 - PCV13) 07/25/2019 (Originally 02/04/2017)  . OPHTHALMOLOGY EXAM  02/10/2019  . HEMOGLOBIN A1C  04/27/2019   Cancer Screenings: Lung: Low Dose CT Chest recommended if Age 20-80 years, 30 pack-year currently smoking OR have quit w/in 15years. Patient does qualify.  Colorectal: overdue since 2018- declines follow up on this. "going to wait to see what's going on with this pandemic"   Pt declines CT chest- declines screening  Additional Screenings:  Hepatitis C Screening:no and declines      Plan:     I have personally reviewed and noted the following in the patient's chart:   . Medical and social history . Use of alcohol, tobacco or illicit drugs  . Current medications and supplements . Functional ability and status . Nutritional status . Physical  activity . Advanced directives . List of other physicians . Hospitalizations, surgeries, and ER visits in previous 12 months . Vitals . Screenings to include cognitive, depression, and falls . Referrals and appointments  In addition, I have reviewed and discussed with patient certain preventive protocols, quality metrics, and best practice recommendations. A written personalized care plan for preventive services as well as general preventive health recommendations were provided to patient.     Lauree Chandler, NP  01/22/2019

## 2019-01-22 NOTE — Patient Instructions (Signed)
Curtis Allison , Thank you for taking time to come for your Medicare Wellness Visit. I appreciate your ongoing commitment to your health goals. Please review the following plan we discussed and let me know if I can assist you in the future.   Screening recommendations/referrals: Colonoscopy over due- need to schedule Recommended yearly ophthalmology/optometry visit for glaucoma screening and checkup Recommended yearly dental visit for hygiene and checkup  Vaccinations: Influenza vaccine DUE NOW Pneumococcal vaccine -recommended to have prevnar 13 & pneumovax 23 Tdap vaccine recommended to have Shingles vaccine recommended to have  Advanced directives: recommend you complete advance directive paperwork so your wishes will be known if you are unable to make these decisions yourself  Conditions/risks identified: complications related to postponing screening and lack of vaccination.   Next appointment: 1 year  Preventive Care 11 Years and Older, Male Preventive care refers to lifestyle choices and visits with your health care provider that can promote health and wellness. What does preventive care include?  A yearly physical exam. This is also called an annual well check.  Dental exams once or twice a year.  Routine eye exams. Ask your health care provider how often you should have your eyes checked.  Personal lifestyle choices, including:  Daily care of your teeth and gums.  Regular physical activity.  Eating a healthy diet.  Avoiding tobacco and drug use.  Limiting alcohol use.  Practicing safe sex.  Taking low doses of aspirin every day.  Taking vitamin and mineral supplements as recommended by your health care provider. What happens during an annual well check? The services and screenings done by your health care provider during your annual well check will depend on your age, overall health, lifestyle risk factors, and family history of disease. Counseling  Your health  care provider may ask you questions about your:  Alcohol use.  Tobacco use.  Drug use.  Emotional well-being.  Home and relationship well-being.  Sexual activity.  Eating habits.  History of falls.  Memory and ability to understand (cognition).  Work and work Statistician. Screening  You may have the following tests or measurements:  Height, weight, and BMI.  Blood pressure.  Lipid and cholesterol levels. These may be checked every 5 years, or more frequently if you are over 24 years old.  Skin check.  Lung cancer screening. You may have this screening every year starting at age 53 if you have a 30-pack-year history of smoking and currently smoke or have quit within the past 15 years.  Fecal occult blood test (FOBT) of the stool. You may have this test every year starting at age 49.  Flexible sigmoidoscopy or colonoscopy. You may have a sigmoidoscopy every 5 years or a colonoscopy every 10 years starting at age 65.  Prostate cancer screening. Recommendations will vary depending on your family history and other risks.  Hepatitis C blood test.  Hepatitis B blood test.  Sexually transmitted disease (STD) testing.  Diabetes screening. This is done by checking your blood sugar (glucose) after you have not eaten for a while (fasting). You may have this done every 1-3 years.  Abdominal aortic aneurysm (AAA) screening. You may need this if you are a current or former smoker.  Osteoporosis. You may be screened starting at age 93 if you are at high risk. Talk with your health care provider about your test results, treatment options, and if necessary, the need for more tests. Vaccines  Your health care provider may recommend certain vaccines, such as:  Influenza vaccine. This is recommended every year.  Tetanus, diphtheria, and acellular pertussis (Tdap, Td) vaccine. You may need a Td booster every 10 years.  Zoster vaccine. You may need this after age 28.   Pneumococcal 13-valent conjugate (PCV13) vaccine. One dose is recommended after age 66.  Pneumococcal polysaccharide (PPSV23) vaccine. One dose is recommended after age 21. Talk to your health care provider about which screenings and vaccines you need and how often you need them. This information is not intended to replace advice given to you by your health care provider. Make sure you discuss any questions you have with your health care provider. Document Released: 06/27/2015 Document Revised: 02/18/2016 Document Reviewed: 04/01/2015 Elsevier Interactive Patient Education  2017 [place] Prevention in the Home Falls can cause injuries. They can happen to people of all ages. There are many things you can do to make your home safe and to help prevent falls. What can I do on the outside of my home?  Regularly fix the edges of walkways and driveways and fix any cracks.  Remove anything that might make you trip as you walk through a door, such as a raised step or threshold.  Trim any bushes or trees on the path to your home.  Use bright outdoor lighting.  Clear any walking paths of anything that might make someone trip, such as rocks or tools.  Regularly check to see if handrails are loose or broken. Make sure that both sides of any steps have handrails.  Any raised decks and porches should have guardrails on the edges.  Have any leaves, snow, or ice cleared regularly.  Use sand or salt on walking paths during winter.  Clean up any spills in your garage right away. This includes oil or grease spills. What can I do in the bathroom?  Use night lights.  Install grab bars by the toilet and in the tub and shower. Do not use towel bars as grab bars.  Use non-skid mats or decals in the tub or shower.  If you need to sit down in the shower, use a plastic, non-slip stool.  Keep the floor dry. Clean up any water that spills on the floor as soon as it happens.  Remove soap  buildup in the tub or shower regularly.  Attach bath mats securely with double-sided non-slip rug tape.  Do not have throw rugs and other things on the floor that can make you trip. What can I do in the bedroom?  Use night lights.  Make sure that you have a light by your bed that is easy to reach.  Do not use any sheets or blankets that are too big for your bed. They should not hang down onto the floor.  Have a firm chair that has side arms. You can use this for support while you get dressed.  Do not have throw rugs and other things on the floor that can make you trip. What can I do in the kitchen?  Clean up any spills right away.  Avoid walking on wet floors.  Keep items that you use a lot in easy-to-reach places.  If you need to reach something above you, use a strong step stool that has a grab bar.  Keep electrical cords out of the way.  Do not use floor polish or wax that makes floors slippery. If you must use wax, use non-skid floor wax.  Do not have throw rugs and other things on the floor that  can make you trip. What can I do with my stairs?  Do not leave any items on the stairs.  Make sure that there are handrails on both sides of the stairs and use them. Fix handrails that are broken or loose. Make sure that handrails are as long as the stairways.  Check any carpeting to make sure that it is firmly attached to the stairs. Fix any carpet that is loose or worn.  Avoid having throw rugs at the top or bottom of the stairs. If you do have throw rugs, attach them to the floor with carpet tape.  Make sure that you have a light switch at the top of the stairs and the bottom of the stairs. If you do not have them, ask someone to add them for you. What else can I do to help prevent falls?  Wear shoes that:  Do not have high heels.  Have rubber bottoms.  Are comfortable and fit you well.  Are closed at the toe. Do not wear sandals.  If you use a stepladder:  Make  sure that it is fully opened. Do not climb a closed stepladder.  Make sure that both sides of the stepladder are locked into place.  Ask someone to hold it for you, if possible.  Clearly mark and make sure that you can see:  Any grab bars or handrails.  First and last steps.  Where the edge of each step is.  Use tools that help you move around (mobility aids) if they are needed. These include:  Canes.  Walkers.  Scooters.  Crutches.  Turn on the lights when you go into a dark area. Replace any light bulbs as soon as they burn out.  Set up your furniture so you have a clear path. Avoid moving your furniture around.  If any of your floors are uneven, fix them.  If there are any pets around you, be aware of where they are.  Review your medicines with your doctor. Some medicines can make you feel dizzy. This can increase your chance of falling. Ask your doctor what other things that you can do to help prevent falls. This information is not intended to replace advice given to you by your health care provider. Make sure you discuss any questions you have with your health care provider. Document Released: 03/27/2009 Document Revised: 11/06/2015 Document Reviewed: 07/05/2014 Elsevier Interactive Patient Education  2017 [name].

## 2019-01-26 ENCOUNTER — Other Ambulatory Visit: Payer: Self-pay

## 2019-01-26 ENCOUNTER — Other Ambulatory Visit: Payer: Medicare Other

## 2019-01-26 DIAGNOSIS — E1169 Type 2 diabetes mellitus with other specified complication: Secondary | ICD-10-CM

## 2019-01-26 DIAGNOSIS — E119 Type 2 diabetes mellitus without complications: Secondary | ICD-10-CM

## 2019-01-26 DIAGNOSIS — I10 Essential (primary) hypertension: Secondary | ICD-10-CM

## 2019-01-26 DIAGNOSIS — M1A9XX0 Chronic gout, unspecified, without tophus (tophi): Secondary | ICD-10-CM

## 2019-01-27 LAB — COMPLETE METABOLIC PANEL WITH GFR
AG Ratio: 1.7 (calc) (ref 1.0–2.5)
ALT: 16 U/L (ref 9–46)
AST: 17 U/L (ref 10–35)
Albumin: 4.3 g/dL (ref 3.6–5.1)
Alkaline phosphatase (APISO): 64 U/L (ref 35–144)
BUN: 12 mg/dL (ref 7–25)
CO2: 26 mmol/L (ref 20–32)
Calcium: 9.5 mg/dL (ref 8.6–10.3)
Chloride: 104 mmol/L (ref 98–110)
Creat: 0.92 mg/dL (ref 0.70–1.25)
GFR, Est African American: 100 mL/min/{1.73_m2} (ref >=60)
GFR, Est Non African American: 86 mL/min/{1.73_m2} (ref >=60)
Globulin: 2.6 g/dL (calc) (ref 1.9–3.7)
Glucose, Bld: 178 mg/dL — ABNORMAL HIGH (ref 65–99)
Potassium: 3.6 mmol/L (ref 3.5–5.3)
Sodium: 142 mmol/L (ref 135–146)
Total Bilirubin: 0.5 mg/dL (ref 0.2–1.2)
Total Protein: 6.9 g/dL (ref 6.1–8.1)

## 2019-01-27 LAB — HEMOGLOBIN A1C
Hgb A1c MFr Bld: 8.3 % of total Hgb — ABNORMAL HIGH (ref <5.7)
Mean Plasma Glucose: 192 (calc)
eAG (mmol/L): 10.6 (calc)

## 2019-01-27 LAB — LIPID PANEL
Cholesterol: 137 mg/dL (ref <200)
HDL: 49 mg/dL (ref >=40)
LDL Cholesterol (Calc): 54 mg/dL (calc)
Non-HDL Cholesterol (Calc): 88 mg/dL (calc) (ref <130)
Total CHOL/HDL Ratio: 2.8 (calc) (ref <5.0)
Triglycerides: 289 mg/dL — ABNORMAL HIGH (ref <150)

## 2019-01-27 LAB — URIC ACID: Uric Acid, Serum: 8 mg/dL (ref 4.0–8.0)

## 2019-01-29 ENCOUNTER — Other Ambulatory Visit: Payer: Self-pay

## 2019-01-29 ENCOUNTER — Encounter: Payer: Self-pay | Admitting: Nurse Practitioner

## 2019-01-29 ENCOUNTER — Ambulatory Visit (INDEPENDENT_AMBULATORY_CARE_PROVIDER_SITE_OTHER): Payer: Medicare Other | Admitting: Nurse Practitioner

## 2019-01-29 DIAGNOSIS — E782 Mixed hyperlipidemia: Secondary | ICD-10-CM | POA: Diagnosis not present

## 2019-01-29 DIAGNOSIS — I10 Essential (primary) hypertension: Secondary | ICD-10-CM | POA: Diagnosis not present

## 2019-01-29 DIAGNOSIS — E1165 Type 2 diabetes mellitus with hyperglycemia: Secondary | ICD-10-CM

## 2019-01-29 DIAGNOSIS — M1A9XX0 Chronic gout, unspecified, without tophus (tophi): Secondary | ICD-10-CM | POA: Diagnosis not present

## 2019-01-29 MED ORDER — GLIPIZIDE 5 MG PO TABS: 2.5000 mg | ORAL_TABLET | Freq: Every day | ORAL | 0 refills | Status: DC

## 2019-01-29 NOTE — Progress Notes (Signed)
This service is provided via telemedicine  No vital signs collected/recorded due to the encounter was a telemedicine visit.   Location of patient (ex: home, work):  Home   Patient consents to a telephone visit:  Yes  Location of the provider (ex: office, home): [facility], Office   Name of any referring provider:  N/A  Names of all persons participating in the telemedicine service and their role in the encounter: S.Chrae B/CMA, Sherrie Mustache, NP, and Patient   Time spent on call: 8 min with medical assistant      Careteam: Patient Care Team: Lauree Chandler, NP as PCP - General (Geriatric Medicine) Crista Luria, MD as Attending Physician (Dermatology) Joseph Art, OD (Optometry)  Advanced Directive information Does Patient Have a Medical Advance Directive?: No, Would patient like information on creating a medical advance directive?: No - Patient declined  Allergies  Allergen Reactions   Penicillins Itching   Jardiance [Empagliflozin] Itching    Increased urination/penile rash    Chief Complaint  Patient presents with   Medical Management of Chronic Issues    3 month follow-up and discuss labs (copy printed and mailed with AVS). Telephone visit    Best Practice Recommendations    Discuss need for Hep C screening and foot exam.    Quality Metric Gaps    Discuss need for colonoscopy      HPI: Patient is a 68 y.o. male for routine follow up.   DM- states he is working on diet. Taking metformin 1000 mg by mouth twice daily, actos 30 mg daily. Could not afford [place] (did not notify us that he could not afford)  Could not afford jardiance either  -pt then states he was not sure he was not even sure he could get it.  States he tried good rx. Blood sugars 140-150, "does not look at that being serious"  Eyes were checked last year. "they were good" No blurred vision, no neuropathy.   Hyperlipidemia- triglycerides remains elevated. States  he is taking crestor 10 mg daily, Reports "something is not right with lab" does not eat any fried food.  States he is drinking 3-4 shots 3 days and on the weekend.   GOUT- uric acid remains elevated at 8.0; allopurinol 100 mg by mouth daily. "I do not have any issues with that"   Hypertension- reports home blood pressure 130(something)/86, states it is always up in the office.  Check blood pressure at home and "blood pressure is normal" 116/60(something) lately.   Review of Systems:  Review of Systems  Constitutional: Negative for chills, fever and weight loss.  HENT: Negative for tinnitus.   Respiratory: Negative for cough, sputum production and shortness of breath.   Cardiovascular: Negative for chest pain, palpitations and leg swelling.  Gastrointestinal: Negative for abdominal pain, constipation, diarrhea and heartburn.  Genitourinary: Negative for dysuria, frequency and urgency.  Musculoskeletal: Negative for back pain, falls, joint pain and myalgias.  Skin: Negative.   Neurological: Negative for dizziness, tingling and headaches.  Psychiatric/Behavioral: Negative for depression and memory loss. The patient does not have insomnia.     Past Medical History:  Diagnosis Date   Arthritis    Diabetes mellitus ([employer])    type II ; diagnosed 2006   Gout    Hyperlipidemia    Hypertension    diagnosed 2006   Inguinal hernia    Past Surgical History:  Procedure Laterality Date   INGUINAL HERNIA REPAIR Right 04/01/2014   Procedure:  LAPAROSCOPIC RIGHT  INGUINAL HERNIA REPAIR WITH MESH;  Surgeon: Ralene Ok, MD;  Location: [facility];  Service: General;  Laterality: Right;   Social History:   reports that he quit smoking about 10 years ago. His smoking use included cigarettes. He has a 40.00 pack-year smoking history. He has never used smokeless tobacco. He reports current alcohol use of about 3.0 standard drinks of alcohol per week. He reports that he does not use  drugs.  Family History  Problem Relation Age of Onset   Diabetes Mother    Hypertension Mother    Lung cancer Mother    Diabetes Sister    Hypertension Sister    Heart disease Father    Diabetes Father    Hypertension Father    Colon cancer Maternal Grandfather     Medications: Patient's Medications  New Prescriptions   No medications on file  Previous Medications   ACCU-CHEK FASTCLIX LANCETS MISC    1 each by Other route daily. Dx: E11.65   ALLOPURINOL (ZYLOPRIM) 100 MG TABLET    Take 2 tablets (200 mg total) by mouth daily.   AMLODIPINE (NORVASC) 10 MG TABLET    TAKE ONE TABLET BY MOUTH EVERY EVENING   ASPIRIN EC 81 MG TABLET    Take 1 tablet (81 mg total) by mouth daily.   ATENOLOL (TENORMIN) 50 MG TABLET    TAKE ONE TABLET BY MOUTH EVERY MORNING   GLUCOSE BLOOD (ACCU-CHEK GUIDE) TEST STRIP    1 each by Other route daily. Dx: E11.65   HYDROCORTISONE CREAM 1 %    Apply 1 application topically once as needed for itching.   LISINOPRIL (ZESTRIL) 40 MG TABLET    TAKE ONE TABLET BY MOUTH EVERY MORNING   METFORMIN (GLUCOPHAGE-XR) 500 MG 24 HR TABLET    Take 2 tablets (1,000 mg total) by mouth 2 (two) times daily. E11.9   NAPROXEN SODIUM (ALEVE) 220 MG TABLET    Take 220 mg daily as needed by mouth.   PIOGLITAZONE (ACTOS) 30 MG TABLET    TAKE ONE TABLET BY MOUTH DAILY   ROSUVASTATIN (CRESTOR) 10 MG TABLET    TAKE ONE TABLET BY MOUTH DAILY   TETRAHYDROZOLINE HCL (VISINE EXTRA OP)    Place 2 drops into both eyes once as needed (dry eyes.).  Modified Medications   No medications on file  Discontinued Medications   No medications on file    Physical Exam:  There were no vitals filed for this visit. There is no height or weight on file to calculate BMI. Wt Readings from Last 3 Encounters:  01/22/19 243 lb (110.2 kg)  07/24/18 244 lb (110.7 kg)  04/20/18 237 lb (107.5 kg)      Labs reviewed: Basic Metabolic Panel: Recent Labs    07/18/18 0921 10/25/18 0850  01/26/19 0855  NA 140 139 142  K 3.7 4.0 3.6  CL 102 102 104  CO2 25 27 26   GLUCOSE 149* 191* 178*  BUN 12 12 12   CREATININE 1.08 0.96 0.92  CALCIUM 9.7 9.6 9.5   Liver Function Tests: Recent Labs    07/18/18 0921 10/25/18 0850 01/26/19 0855  AST 18 19 17   ALT 18 18 16   BILITOT 0.5 0.7 0.5  PROT 6.9 7.0 6.9   No results for input(s): LIPASE, AMYLASE in the last 8760 hours. No results for input(s): AMMONIA in the last 8760 hours. CBC: Recent Labs    07/18/18 0921  WBC 10.0  NEUTROABS 5,040  HGB 14.8  HCT 41.6  MCV 91.2  PLT 228   Lipid Panel: Recent Labs    04/14/18 0920 07/18/18 0921 10/25/18 0850 01/26/19 0855  CHOL 142 184 129 137  HDL 44 40 48 49  LDLCALC 57  --  52 54  TRIG 394* 752* 250* 289*  CHOLHDL 3.2 4.6 2.7 2.8   TSH: No results for input(s): TSH in the last 8760 hours. A1C: Lab Results  Component Value Date   HGBA1C 8.3 (H) 01/26/2019     Assessment/Plan 1. Type 2 diabetes mellitus with hyperglycemia, without long-term current use of insulin ([place]) -pt does not feel like blood sugars are "that big of a deal" states that his home readings of 150 are good. Again educated on long term effects of uncontrolled diabetes and he "does not feel like this is a problem" despite ongoing education of disease process and the effects of elevated blood sugar has on the body over time.  - Ambulatory referral to Endocrinology  - glipiZIDE (GLUCOTROL) 5 MG tablet; Take 0.5 tablets (2.5 mg total) by mouth daily before breakfast.  Dispense: 60 tablet; Refill: 0 -will follow up in 4 weeks to make sure he is not having hypoglycemia and will most likely need to increase to glipizide 5 mg daily -discussed risk of hypoglycemia with glipizide -pt continues to refuse to start any insulin  -Encouraged dietary compliance, routine foot care/monitoring and to keep up with diabetic eye exams through ophthalmology    2. Mixed hyperlipidemia Triglyceride continues to be  elevated, pt admits to be drinking multiple shots of brandy throughout the week and on the weekend, encouraged to continue to work on diet and to cut back significantly on alcohol. Also eating fried chicken. Continues on crestor 10 mg daily  3. Essential hypertension -pt reports blood pressure better at home then in office. Will continue current regimen with encouragement on dietary compliance.   4. Chronic gout without tophus, unspecified cause, unspecified site -uric acid remains elevated, pt states he is taking allopurinol 100 mg daily, to increase to 2 tablets daily as prescribed and to work on dietary modifications.   Next appt: 4 weeks on diabetes  Stephaniemarie Stoffel K. Harle Battiest  [facility] [phone]    Virtual Visit via Telephone Note  I connected with pt on 01/29/19 at  2:45 PM EDT by telephone and verified that I am speaking with the correct person using two identifiers.  Location: Patient: home Provider: office   I discussed the limitations, risks, security and privacy concerns of performing an evaluation and management service by telephone and the availability of in person appointments. I also discussed with the patient that there may be a patient responsible charge related to this service. The patient expressed understanding and agreed to proceed.   I discussed the assessment and treatment plan with the patient. The patient was provided an opportunity to ask questions and all were answered. The patient agreed with the plan and demonstrated an understanding of the instructions.   The patient was advised to call back or seek an in-person evaluation if the symptoms worsen or if the condition fails to improve as anticipated.  I provided 25 minutes of non-face-to-face time during this encounter.  [employer]. Harle Battiest Avs printed and mailed

## 2019-01-29 NOTE — Patient Instructions (Addendum)
Increase allopurinol to 100 mg by mouth twice daily (200 mg daily)  To decrease alcohol intake  To continue to work on heart healthy, low triglyceride, diabetic diet.   Recommending endocrinology referral at this time  To start glipizide 2.5 mg by mouth daily and continue to monitor blood sugar. Bring blood sugar readings with you to follow up.   Low-Purine Eating Plan A low-purine eating plan involves making food choices to limit your intake of purine. Purine is a kind of uric acid. Too much uric acid in your blood can cause certain conditions, such as gout and kidney stones. Eating a low-purine diet can help control these conditions. What are tips for following this plan? Reading food labels   Avoid foods with saturated or Trans fat.  Check the ingredient list of grains-based foods, such as bread and cereal, to make sure that they contain whole grains.  Check the ingredient list of sauces or soups to make sure they do not contain meat or fish.  When choosing soft drinks, check the ingredient list to make sure they do not contain high-fructose corn syrup. Shopping  Buy plenty of fresh fruits and vegetables.  Avoid buying canned or fresh fish.  Buy dairy products labeled as low-fat or nonfat.  Avoid buying premade or processed foods. These foods are often high in fat, salt (sodium), and added sugar. Cooking  Use olive oil instead of butter when cooking. Oils like olive oil, canola oil, and sunflower oil contain healthy fats. Meal planning  Learn which foods do or do not affect you. If you find out that a food tends to cause your gout symptoms to flare up, avoid eating that food. You can enjoy foods that do not cause problems. If you have any questions about a food item, talk with your dietitian or health care provider.  Limit foods high in fat, especially saturated fat. Fat makes it harder for your body to get rid of uric acid.  Choose foods that are lower in fat and are  lean sources of protein. General guidelines  Limit alcohol intake to no more than 1 drink a day for nonpregnant women and 2 drinks a day for men. One drink equals 12 oz of beer, 5 oz of wine, or 1 oz of hard liquor. Alcohol can affect the way your body gets rid of uric acid.  Drink plenty of water to keep your urine clear or pale yellow. Fluids can help remove uric acid from your body.  If directed by your health care provider, take a vitamin C supplement.  Work with your health care provider and dietitian to develop a plan to achieve or maintain a healthy weight. Losing weight can help reduce uric acid in your blood. What foods are recommended? The items listed may not be a complete list. Talk with your dietitian about what dietary choices are best for you. Foods low in purines Foods low in purines do not need to be limited. These include:  All fruits.  All low-purine vegetables, pickles, and olives.  Breads, pasta, rice, cornbread, and popcorn. Cake and other baked goods.  All dairy foods.  Eggs, nuts, and nut butters.  Spices and condiments, such as salt, herbs, and vinegar.  Plant oils, butter, and margarine.  Water, sugar-free soft drinks, tea, coffee, and cocoa.  Vegetable-based soups, broths, sauces, and gravies. Foods moderate in purines Foods moderate in purines should be limited to the amounts listed.   cup of asparagus, cauliflower, spinach, mushrooms, or green  peas, each day.  2/3 cup uncooked oatmeal, each day.   cup dry wheat bran or wheat germ, each day.  2-3 ounces of meat or poultry, each day.  4-6 ounces of shellfish, such as crab, lobster, oysters, or shrimp, each day.  1 cup cooked beans, peas, or lentils, each day.  Soup, broths, or bouillon made from meat or fish. Limit these foods as much as possible. What foods are not recommended? The items listed may not be a complete list. Talk with your dietitian about what dietary choices are best for  you. Limit your intake of foods high in purines, including:  Beer and other alcohol.  Meat-based gravy or sauce.  Canned or fresh fish, such as: ? Anchovies, sardines, herring, and tuna. ? Mussels and scallops. ? Codfish, trout, and haddock.  Curtis Allison.  Organ meats, such as: ? Liver or kidney. ? Tripe. ? Sweetbreads (thymus gland or pancreas).  [name].  Yeast or yeast extract supplements.  Drinks sweetened with high-fructose corn syrup. Summary  Eating a low-purine diet can help control conditions caused by too much uric acid in the body, such as gout or kidney stones.  Choose low-purine foods, limit alcohol, and limit foods high in fat.  You will learn over time which foods do or do not affect you. If you find out that a food tends to cause your gout symptoms to flare up, avoid eating that food. This information is not intended to replace advice given to you by your health care provider. Make sure you discuss any questions you have with your health care provider. Document Released: 09/25/2010 Document Revised: 05/13/2017 Document Reviewed: 07/14/2016 Elsevier Patient Education  2020 [name].

## 2019-02-08 ENCOUNTER — Other Ambulatory Visit: Payer: Self-pay | Admitting: *Deleted

## 2019-02-08 DIAGNOSIS — E1169 Type 2 diabetes mellitus with other specified complication: Secondary | ICD-10-CM

## 2019-02-08 DIAGNOSIS — E785 Hyperlipidemia, unspecified: Secondary | ICD-10-CM

## 2019-02-08 MED ORDER — ROSUVASTATIN CALCIUM 10 MG PO TABS: 10.0000 mg | ORAL_TABLET | Freq: Every day | ORAL | 2 refills | Status: DC

## 2019-02-08 NOTE — Telephone Encounter (Signed)
[employer] Francis King 

## 2019-02-20 ENCOUNTER — Other Ambulatory Visit: Payer: Self-pay | Admitting: *Deleted

## 2019-02-20 MED ORDER — ALLOPURINOL 100 MG PO TABS: 200.0000 mg | ORAL_TABLET | Freq: Every day | ORAL | 5 refills | Status: AC

## 2019-02-20 NOTE — Telephone Encounter (Signed)
Curtis Allison 

## 2019-02-26 ENCOUNTER — Other Ambulatory Visit: Payer: Self-pay

## 2019-02-26 ENCOUNTER — Ambulatory Visit (INDEPENDENT_AMBULATORY_CARE_PROVIDER_SITE_OTHER): Payer: Medicare Other | Admitting: Nurse Practitioner

## 2019-02-26 ENCOUNTER — Encounter: Payer: Self-pay | Admitting: Nurse Practitioner

## 2019-02-26 VITALS — BP 180/96 | HR 95 | Temp 98.7°F | Ht 70.0 in | Wt 241.0 lb

## 2019-02-26 DIAGNOSIS — I10 Essential (primary) hypertension: Secondary | ICD-10-CM | POA: Diagnosis not present

## 2019-02-26 DIAGNOSIS — E782 Mixed hyperlipidemia: Secondary | ICD-10-CM | POA: Diagnosis not present

## 2019-02-26 DIAGNOSIS — E1165 Type 2 diabetes mellitus with hyperglycemia: Secondary | ICD-10-CM | POA: Diagnosis not present

## 2019-02-26 DIAGNOSIS — M1A9XX0 Chronic gout, unspecified, without tophus (tophi): Secondary | ICD-10-CM

## 2019-02-26 MED ORDER — ACCU-CHEK FASTCLIX LANCETS MISC: 1.0000 | Freq: Every day | 6 refills | Status: AC

## 2019-02-26 MED ORDER — ACCU-CHEK GUIDE VI STRP: 1.0000 | ORAL_STRIP | Freq: Every day | 6 refills | Status: DC

## 2019-02-26 NOTE — Progress Notes (Signed)
Careteam: Patient Care Team: Lauree Chandler, NP as PCP - General (Geriatric Medicine) Crista Luria, MD as Attending Physician (Dermatology) Joseph Art, OD (Optometry)  Advanced Directive information    Allergies  Allergen Reactions  . Penicillins Itching  . Jardiance [Empagliflozin] Itching    Increased urination/penile rash    Chief Complaint  Patient presents with  . Follow-up    4 week follow-up on elevated blood pressure. Patient with at home readings      HPI: Patient is a 68 y.o. male seen in the office today blood pressure and blood sugar review.  DM- increased glipizide to a whole tablet, unable to cut in half. No hypoglycemic episode. Blood sugars were 170s but he increased to whole due to not being able to cut and now blood sugars 99-147 Also taking actos and  metformin 500 mg at breakfast and supper -cut down metformin due to the improved glucose reading.   Blood pressure- took medication today- taking norvasc 10 mg daily, atenolol 50 mg daily and lisinopril 40 mg daily  Home readings ranging from 128-147/79-97 No leg swelling, no shortness of breath, no headache or palpitations. States blood pressure at home before coming to office was 145/86  Review of Systems:  Review of Systems  Constitutional: Negative for chills, fever and weight loss.  HENT: Negative for tinnitus.   Respiratory: Negative for cough, sputum production and shortness of breath.   Cardiovascular: Negative for chest pain, palpitations and leg swelling.  Gastrointestinal: Negative for abdominal pain, constipation, diarrhea and heartburn.  Genitourinary: Negative for dysuria, frequency and urgency.  Musculoskeletal: Negative for back pain, falls, joint pain and myalgias.  Skin: Negative.   Neurological: Negative for dizziness, tingling and headaches.  Psychiatric/Behavioral: Negative for depression and memory loss. The patient does not have insomnia.     Past Medical History:   Diagnosis Date  . Arthritis   . Diabetes mellitus ([place])    type II ; diagnosed 2006  . Gout   . Hyperlipidemia   . Hypertension    diagnosed 2006  . Inguinal hernia    Past Surgical History:  Procedure Laterality Date  . INGUINAL HERNIA REPAIR Right 04/01/2014   Procedure: LAPAROSCOPIC RIGHT  INGUINAL HERNIA REPAIR WITH MESH;  Surgeon: Ralene Ok, MD;  Location: [facility];  Service: General;  Laterality: Right;   Social History:   reports that he quit smoking about 10 years ago. His smoking use included cigarettes. He has a 40.00 pack-year smoking history. He has never used smokeless tobacco. He reports current alcohol use of about 3.0 standard drinks of alcohol per week. He reports that he does not use drugs.  Family History  Problem Relation Age of Onset  . Diabetes Mother   . Hypertension Mother   . Lung cancer Mother   . Diabetes Sister   . Hypertension Sister   . Heart disease Father   . Diabetes Father   . Hypertension Father   . Colon cancer Maternal Grandfather     Medications: Patient's Medications  New Prescriptions   No medications on file  Previous Medications   ACCU-CHEK FASTCLIX LANCETS MISC    1 each by Other route daily. Dx: E11.65   ALLOPURINOL (ZYLOPRIM) 100 MG TABLET    Take 2 tablets (200 mg total) by mouth daily.   AMLODIPINE (NORVASC) 10 MG TABLET    TAKE ONE TABLET BY MOUTH EVERY EVENING   ASPIRIN EC 81 MG TABLET    Take 1 tablet (81 mg  total) by mouth daily.   ATENOLOL (TENORMIN) 50 MG TABLET    TAKE ONE TABLET BY MOUTH EVERY MORNING   GLIPIZIDE (GLUCOTROL) 5 MG TABLET    Take 0.5 tablets (2.5 mg total) by mouth daily before breakfast.   GLUCOSE BLOOD (ACCU-CHEK GUIDE) TEST STRIP    1 each by Other route daily. Dx: E11.65   HYDROCORTISONE CREAM 1 %    Apply 1 application topically once as needed for itching.   LISINOPRIL (ZESTRIL) 40 MG TABLET    TAKE ONE TABLET BY MOUTH EVERY MORNING   METFORMIN (GLUCOPHAGE-XR) 500 MG 24 HR TABLET    Take 2  tablets (1,000 mg total) by mouth 2 (two) times daily. E11.9   NAPROXEN SODIUM (ALEVE) 220 MG TABLET    Take 220 mg daily as needed by mouth.   PIOGLITAZONE (ACTOS) 30 MG TABLET    TAKE ONE TABLET BY MOUTH DAILY   ROSUVASTATIN (CRESTOR) 10 MG TABLET    Take 1 tablet (10 mg total) by mouth daily.   TETRAHYDROZOLINE HCL (VISINE EXTRA OP)    Place 2 drops into both eyes once as needed (dry eyes.).  Modified Medications   No medications on file  Discontinued Medications   No medications on file    Physical Exam:  Vitals:   02/26/19 1558  BP: (!) 180/96  Pulse: 95  Temp: 98.7 F (37.1 C)  TempSrc: Temporal  SpO2: 97%  Weight: 241 lb (109.3 kg)  Height: '5\' 10"'  (1.778 m)   Body mass index is 34.58 kg/m. Wt Readings from Last 3 Encounters:  02/26/19 241 lb (109.3 kg)  01/22/19 243 lb (110.2 kg)  07/24/18 244 lb (110.7 kg)    Physical Exam Constitutional:      Appearance: He is well-developed.  Eyes:     General: No scleral icterus.    Pupils: Pupils are equal, round, and reactive to light.  Neck:     Musculoskeletal: Neck supple.     Thyroid: No thyromegaly.     Vascular: No carotid bruit.  Cardiovascular:     Rate and Rhythm: Normal rate and regular rhythm.     Heart sounds: Murmur (1/6 SEM) present. No friction rub. No gallop.   Pulmonary:     Effort: Pulmonary effort is normal.     Breath sounds: Normal breath sounds. No wheezing or rales.  Chest:     Chest wall: No tenderness.  Abdominal:     General: Bowel sounds are normal. There is no distension or abdominal bruit.     Palpations: Abdomen is soft. There is no hepatomegaly, mass or pulsatile mass.     Tenderness: There is no abdominal tenderness. There is no guarding or rebound.     Comments: obese  Lymphadenopathy:     Cervical: No cervical adenopathy.  Skin:    General: Skin is warm and dry.     Findings: No rash.  Neurological:     Mental Status: He is alert and oriented to person, place, and time.      Deep Tendon Reflexes: Reflexes are normal and symmetric.  Psychiatric:        Behavior: Behavior normal.        Thought Content: Thought content normal.        Judgment: Judgment normal.     Labs reviewed: Basic Metabolic Panel: Recent Labs    07/18/18 0921 10/25/18 0850 01/26/19 0855  NA 140 139 142  K 3.7 4.0 3.6  CL 102 102 104  CO2  '25 27 26  ' GLUCOSE 149* 191* 178*  BUN '12 12 12  ' CREATININE 1.08 0.96 0.92  CALCIUM 9.7 9.6 9.5   Liver Function Tests: Recent Labs    07/18/18 0921 10/25/18 0850 01/26/19 0855  AST '18 19 17  ' ALT '18 18 16  ' BILITOT 0.5 0.7 0.5  PROT 6.9 7.0 6.9   No results for input(s): LIPASE, AMYLASE in the last 8760 hours. No results for input(s): AMMONIA in the last 8760 hours. CBC: Recent Labs    07/18/18 0921  WBC 10.0  NEUTROABS 5,040  HGB 14.8  HCT 41.6  MCV 91.2  PLT 228   Lipid Panel: Recent Labs    04/14/18 0920 07/18/18 0921 10/25/18 0850 01/26/19 0855  CHOL 142 184 129 137  HDL 44 40 48 49  LDLCALC 57  --  52 54  TRIG 394* 752* 250* 289*  CHOLHDL 3.2 4.6 2.7 2.8   TSH: No results for input(s): TSH in the last 8760 hours. A1C: Lab Results  Component Value Date   HGBA1C 8.3 (H) 01/26/2019     Assessment/Plan 1. Type 2 diabetes mellitus with hyperglycemia, without long-term current use of insulin ([facility]) a1c not at goal last month but blood sugars have improved with glipizide 5 mg daily. To continue to work on dietary modifications.  Will continue glidizide 5 mg daily, actos and metformin twice daily  - Accu-Chek FastClix Lancets MISC; 1 each by Other route daily. Dx: E11.65  Dispense: 100 each; Refill: 6 - glucose blood (ACCU-CHEK GUIDE) test strip; 1 each by Other route daily. Dx: E11.65  Dispense: 100 each; Refill: 6 - Hemoglobin A1c; Future - BMP with eGFR([facility]); Future  2. Mixed hyperlipidemia Continues to work on dietary modifications and crestor 10 mg daily - Lipid Panel; Future  3. Essential hypertension  -elevated today, pt states he does not believe it because blood pressure at home earlier today was much better. Suspect white coat syndrome. He is against getting Clonidine PRN today. Encouraged dietary compliance.   4. Chronic gout without tophus, unspecified cause, unspecified site Continues on allopurinol 200 mg daily.  - Uric acid; Future  Next appt: 3 months with labs prior  Lahoma Constantin K. [place], [facility] [phone]

## 2019-02-26 NOTE — Patient Instructions (Addendum)
Goal blood sugar reading FASTING 80-120 To notify if blood sugars become low   DASH Eating Plan DASH stands for "Dietary Approaches to Stop Hypertension." The DASH eating plan is a healthy eating plan that has been shown to reduce high blood pressure (hypertension). It may also reduce your risk for type 2 diabetes, heart disease, and stroke. The DASH eating plan may also help with weight loss. What are tips for following this plan?  General guidelines  Avoid eating more than 2,300 mg (milligrams) of salt (sodium) a day. If you have hypertension, you may need to reduce your sodium intake to 1,500 mg a day.  Limit alcohol intake to no more than 1 drink a day for nonpregnant women and 2 drinks a day for men. One drink equals 12 oz of beer, 5 oz of wine, or 1 oz of hard liquor.  Work with your health care provider to maintain a healthy body weight or to lose weight. Ask what an ideal weight is for you.  Get at least 30 minutes of exercise that causes your heart to beat faster (aerobic exercise) most days of the week. Activities may include walking, swimming, or biking.  Work with your health care provider or diet and nutrition specialist (dietitian) to adjust your eating plan to your individual calorie needs. Reading food labels   Check food labels for the amount of sodium per serving. Choose foods with less than 5 percent of the Daily Value of sodium. Generally, foods with less than 300 mg of sodium per serving fit into this eating plan.  To find whole grains, look for the word "whole" as the first word in the ingredient list. Shopping  Buy products labeled as "low-sodium" or "no salt added."  Buy fresh foods. Avoid canned foods and premade or frozen meals. Cooking  Avoid adding salt when cooking. Use salt-free seasonings or herbs instead of table salt or sea salt. Check with your health care provider or pharmacist before using salt substitutes.  Do not fry foods. Cook foods using  healthy methods such as baking, boiling, grilling, and broiling instead.  Cook with heart-healthy oils, such as olive, canola, soybean, or sunflower oil. Meal planning  Eat a balanced diet that includes: ? 5 or more servings of fruits and vegetables each day. At each meal, try to fill half of your plate with fruits and vegetables. ? Up to 6-8 servings of whole grains each day. ? Less than 6 oz of lean meat, poultry, or fish each day. A 3-oz serving of meat is about the same size as a deck of cards. One egg equals 1 oz. ? 2 servings of low-fat dairy each day. ? A serving of nuts, seeds, or beans 5 times each week. ? Heart-healthy fats. Healthy fats called Omega-3 fatty acids are found in foods such as flaxseeds and coldwater fish, like sardines, salmon, and mackerel.  Limit how much you eat of the following: ? Canned or prepackaged foods. ? Food that is high in trans fat, such as fried foods. ? Food that is high in saturated fat, such as fatty meat. ? Sweets, desserts, sugary drinks, and other foods with added sugar. ? Full-fat dairy products.  Do not salt foods before eating.  Try to eat at least 2 vegetarian meals each week.  Eat more home-cooked food and less restaurant, buffet, and fast food.  When eating at a restaurant, ask that your food be prepared with less salt or no salt, if possible. What foods are  recommended? The items listed may not be a complete list. Talk with your dietitian about what dietary choices are best for you. Grains Whole-grain or whole-wheat bread. Whole-grain or whole-wheat pasta. Brown rice. [place]. Bulgur. Whole-grain and low-sodium cereals. Pita bread. Low-fat, low-sodium crackers. Whole-wheat flour tortillas. Vegetables Fresh or frozen vegetables (raw, steamed, roasted, or grilled). Low-sodium or reduced-sodium tomato and vegetable juice. Low-sodium or reduced-sodium tomato sauce and tomato paste. Low-sodium or reduced-sodium canned  vegetables. Fruits All fresh, dried, or frozen fruit. Canned fruit in natural juice (without added sugar). Meat and other protein foods Skinless chicken or [place]. Ground chicken or [place]. Pork with fat trimmed off. Fish and seafood. Egg whites. Dried beans, peas, or lentils. Unsalted nuts, nut butters, and seeds. Unsalted canned beans. Lean cuts of beef with fat trimmed off. Low-sodium, lean deli meat. Dairy Low-fat (1%) or fat-free (skim) milk. Fat-free, low-fat, or reduced-fat cheeses. Nonfat, low-sodium ricotta or cottage cheese. Low-fat or nonfat yogurt. Low-fat, low-sodium cheese. Fats and oils Soft margarine without trans fats. Vegetable oil. Low-fat, reduced-fat, or light mayonnaise and salad dressings (reduced-sodium). Canola, safflower, olive, soybean, and sunflower oils. Avocado. Seasoning and other foods Herbs. Spices. Seasoning mixes without salt. Unsalted popcorn and pretzels. Fat-free sweets. What foods are not recommended? The items listed may not be a complete list. Talk with your dietitian about what dietary choices are best for you. Grains Baked goods made with fat, such as croissants, muffins, or some breads. Dry pasta or rice meal packs. Vegetables Creamed or fried vegetables. Vegetables in a cheese sauce. Regular canned vegetables (not low-sodium or reduced-sodium). Regular canned tomato sauce and paste (not low-sodium or reduced-sodium). Regular tomato and vegetable juice (not low-sodium or reduced-sodium). [name]. Olives. Fruits Canned fruit in a light or heavy syrup. Fried fruit. Fruit in cream or butter sauce. Meat and other protein foods Fatty cuts of meat. Ribs. Fried meat. [name]. Sausage. Bologna and other processed lunch meats. Salami. Fatback. Hotdogs. Bratwurst. Salted nuts and seeds. Canned beans with added salt. Canned or smoked fish. Whole eggs or egg yolks. Chicken or [place] with skin. Dairy Whole or 2% milk, cream, and half-and-half. Whole or full-fat  cream cheese. Whole-fat or sweetened yogurt. Full-fat cheese. Nondairy creamers. Whipped toppings. Processed cheese and cheese spreads. Fats and oils Butter. Stick margarine. Lard. Shortening. Ghee. Bacon fat. Tropical oils, such as coconut, palm kernel, or palm oil. Seasoning and other foods Salted popcorn and pretzels. Onion salt, garlic salt, seasoned salt, table salt, and sea salt. Worcestershire sauce. Tartar sauce. Barbecue sauce. Teriyaki sauce. Soy sauce, including reduced-sodium. Steak sauce. Canned and packaged gravies. Fish sauce. Oyster sauce. Cocktail sauce. Horseradish that you find on the shelf. Ketchup. Mustard. Meat flavorings and tenderizers. Bouillon cubes. Hot sauce and Tabasco sauce. Premade or packaged marinades. Premade or packaged taco seasonings. Relishes. Regular salad dressings. Where to find more information:  National Heart, Lung, and [place]: https://wilson-eaton.com/  American Heart Association: www.heart.org Summary  The DASH eating plan is a healthy eating plan that has been shown to reduce high blood pressure (hypertension). It may also reduce your risk for type 2 diabetes, heart disease, and stroke.  With the DASH eating plan, you should limit salt (sodium) intake to 2,300 mg a day. If you have hypertension, you may need to reduce your sodium intake to 1,500 mg a day.  When on the DASH eating plan, aim to eat more fresh fruits and vegetables, whole grains, lean proteins, low-fat dairy, and heart-healthy fats.  Work with your health  care provider or diet and nutrition specialist (dietitian) to adjust your eating plan to your individual calorie needs. This information is not intended to replace advice given to you by your health care provider. Make sure you discuss any questions you have with your health care provider. Document Released: 05/20/2011 Document Revised: 05/13/2017 Document Reviewed: 05/24/2016 Elsevier Patient Education  2020 [name].

## 2019-03-22 ENCOUNTER — Other Ambulatory Visit: Payer: Self-pay | Admitting: Nurse Practitioner

## 2019-03-22 ENCOUNTER — Other Ambulatory Visit: Payer: Self-pay | Admitting: *Deleted

## 2019-03-22 DIAGNOSIS — I10 Essential (primary) hypertension: Secondary | ICD-10-CM

## 2019-03-22 DIAGNOSIS — E1165 Type 2 diabetes mellitus with hyperglycemia: Secondary | ICD-10-CM

## 2019-03-22 MED ORDER — GLIPIZIDE 5 MG PO TABS: 5.0000 mg | ORAL_TABLET | Freq: Every day | ORAL | 3 refills | Status: DC

## 2019-03-22 NOTE — Telephone Encounter (Signed)
Patient requested refill

## 2019-03-27 ENCOUNTER — Other Ambulatory Visit: Payer: Self-pay | Admitting: Nurse Practitioner

## 2019-03-27 DIAGNOSIS — E118 Type 2 diabetes mellitus with unspecified complications: Secondary | ICD-10-CM

## 2019-04-04 ENCOUNTER — Other Ambulatory Visit: Payer: Self-pay | Admitting: Nurse Practitioner

## 2019-04-04 DIAGNOSIS — E118 Type 2 diabetes mellitus with unspecified complications: Secondary | ICD-10-CM

## 2019-04-05 ENCOUNTER — Other Ambulatory Visit: Payer: Self-pay | Admitting: Nurse Practitioner

## 2019-04-05 DIAGNOSIS — E118 Type 2 diabetes mellitus with unspecified complications: Secondary | ICD-10-CM

## 2019-04-05 NOTE — Telephone Encounter (Signed)
Patient called to question why rx for Actos was denied.  I informed patient we approved rx on 03/28/2019 and that's why we denied yesterday. Patient said something is not right and advised that I call the pharmacy to get this figured out.  I called the pharmacy, spoke with pharmacist and was told rx received on 03/28/2019 and has been waiting for patient to pickup.

## 2019-04-24 ENCOUNTER — Other Ambulatory Visit: Payer: Self-pay | Admitting: Nurse Practitioner

## 2019-04-24 DIAGNOSIS — I10 Essential (primary) hypertension: Secondary | ICD-10-CM

## 2019-04-25 ENCOUNTER — Other Ambulatory Visit: Payer: Self-pay | Admitting: Nurse Practitioner

## 2019-04-25 DIAGNOSIS — E1165 Type 2 diabetes mellitus with hyperglycemia: Secondary | ICD-10-CM

## 2019-05-23 ENCOUNTER — Other Ambulatory Visit: Payer: Medicare Other

## 2019-05-23 ENCOUNTER — Other Ambulatory Visit: Payer: Self-pay

## 2019-05-23 DIAGNOSIS — M1A9XX0 Chronic gout, unspecified, without tophus (tophi): Secondary | ICD-10-CM

## 2019-05-23 DIAGNOSIS — E782 Mixed hyperlipidemia: Secondary | ICD-10-CM

## 2019-05-23 DIAGNOSIS — E1165 Type 2 diabetes mellitus with hyperglycemia: Secondary | ICD-10-CM

## 2019-05-24 LAB — HEMOGLOBIN A1C
Hgb A1c MFr Bld: 8.3 % of total Hgb — ABNORMAL HIGH (ref <5.7)
Mean Plasma Glucose: 192 (calc)
eAG (mmol/L): 10.6 (calc)

## 2019-05-24 LAB — BASIC METABOLIC PANEL WITH GFR
BUN: 11 mg/dL (ref 7–25)
CO2: 23 mmol/L (ref 20–32)
Calcium: 9.7 mg/dL (ref 8.6–10.3)
Chloride: 101 mmol/L (ref 98–110)
Creat: 1.18 mg/dL (ref 0.70–1.25)
GFR, Est African American: 74 mL/min/{1.73_m2} (ref >=60)
GFR, Est Non African American: 63 mL/min/{1.73_m2} (ref >=60)
Glucose, Bld: 198 mg/dL — ABNORMAL HIGH (ref 65–99)
Potassium: 3 mmol/L — ABNORMAL LOW (ref 3.5–5.3)
Sodium: 141 mmol/L (ref 135–146)

## 2019-05-24 LAB — LIPID PANEL
Cholesterol: 178 mg/dL (ref <200)
HDL: 67 mg/dL (ref >=40)
LDL Cholesterol (Calc): 74 mg/dL (calc)
Non-HDL Cholesterol (Calc): 111 mg/dL (calc) (ref <130)
Total CHOL/HDL Ratio: 2.7 (calc) (ref <5.0)
Triglycerides: 308 mg/dL — ABNORMAL HIGH (ref <150)

## 2019-05-24 LAB — URIC ACID: Uric Acid, Serum: 8.9 mg/dL — ABNORMAL HIGH (ref 4.0–8.0)

## 2019-05-30 ENCOUNTER — Encounter: Payer: Self-pay | Admitting: Nurse Practitioner

## 2019-05-30 ENCOUNTER — Ambulatory Visit (INDEPENDENT_AMBULATORY_CARE_PROVIDER_SITE_OTHER): Payer: Medicare Other | Admitting: Nurse Practitioner

## 2019-05-30 ENCOUNTER — Other Ambulatory Visit: Payer: Self-pay

## 2019-05-30 DIAGNOSIS — E1165 Type 2 diabetes mellitus with hyperglycemia: Secondary | ICD-10-CM

## 2019-05-30 DIAGNOSIS — E876 Hypokalemia: Secondary | ICD-10-CM | POA: Diagnosis not present

## 2019-05-30 DIAGNOSIS — E782 Mixed hyperlipidemia: Secondary | ICD-10-CM

## 2019-05-30 DIAGNOSIS — M1A9XX0 Chronic gout, unspecified, without tophus (tophi): Secondary | ICD-10-CM

## 2019-05-30 DIAGNOSIS — Z86010 Personal history of colonic polyps: Secondary | ICD-10-CM

## 2019-05-30 DIAGNOSIS — Z9114 Patient's other noncompliance with medication regimen: Secondary | ICD-10-CM

## 2019-05-30 DIAGNOSIS — I10 Essential (primary) hypertension: Secondary | ICD-10-CM

## 2019-05-30 MED ORDER — GLIPIZIDE 5 MG PO TABS: 5.0000 mg | ORAL_TABLET | Freq: Two times a day (BID) | ORAL | 0 refills | Status: DC

## 2019-05-30 NOTE — Patient Instructions (Addendum)
To increase glipizide to 5 mg twice daily with meals (breakfast and supper)   Referral to endocrinology for diabetic management.   Encourage follow up with Gastroenterology for colonoscopy

## 2019-05-30 NOTE — Progress Notes (Signed)
This service is provided via telemedicine  No vital signs collected/recorded due to the encounter was a telemedicine visit.   Location of patient (ex: home, work):  Home  Patient consents to a telephone visit:  Yes  Location of the provider (ex: office, home):  [facility], Office   Name of any referring provider:  N/A  Names of all persons participating in the telemedicine service and their role in the encounter: S.Chrae B/CMA, Sherrie Mustache, NP, and Patient   Time spent on call: 8 min with medical assistant     Careteam: Patient Care Team: Lauree Chandler, NP as PCP - General (Geriatric Medicine) Crista Luria, MD as Attending Physician (Dermatology) Joseph Art, OD (Optometry)  Advanced Directive information Does Patient Have a Medical Advance Directive?: No, Would patient like information on creating a medical advance directive?: No - Patient declined  Allergies  Allergen Reactions  . Penicillins Itching  . Jardiance [Empagliflozin] Itching    Increased urination/penile rash    Chief Complaint  Patient presents with  . Medical Management of Chronic Issues    3 month follow-up and discuss labs (copy printed and mailed with AVS)   . Medication Management    Patient stopped Metformin x a few months due to ineffectivness      HPI: Patient is a 68 y.o. male via telephone visit.   Hyperlipidemia- taking crestor 10 mg daily, triglycerides remain elevated.   Diabetes- reports blood sugars 135-150, A1c 8.3, states "the lab is not right, I do not believe that number"  Stopped taking metformin.- "never did anything for me" "feels like I have taken a salt pill"  Pt reports "Blood sugar is not reading that high" when taking at home, "never higher than 150 and I take it often" Reports he is taking glipizide 5 mg daily and actos 30 mg daily Has not gone to eye doctor yet, "planning to go"  no changes in vision. Looks at feet daily, no abnormalities or  wounds.  Hypokalemia- potassium of 3.0 noted on recent lab, pt denies  recent diarrhea or vomiting.   Hypertension- reports normal at home 130 "something" over 70 "something"- taking Lisinopril, atenolol, and norvasc daily. When asked an exact reading pt repeats "Its normal, 130 "something" over 70 "something"  Gout- uric acid level elevated, does not take allopurinol daily "I know my body and gout level is wrong"  Declines flu shot- does not get flu shots  Reports he had polyps on colonoscopy in 2013- aware he is overdue for colonoscopy since 2018 but declines follow up at this time.   Review of Systems:  Review of Systems  Constitutional: Negative for chills, fever and weight loss.  Eyes: Negative for blurred vision.  Respiratory: Negative for cough, sputum production and shortness of breath.   Cardiovascular: Negative for chest pain, palpitations and leg swelling.  Gastrointestinal: Negative for abdominal pain, constipation, diarrhea and heartburn.  Genitourinary: Positive for frequency. Negative for dysuria and urgency.  Musculoskeletal: Negative for back pain, falls, joint pain and myalgias.  Skin: Negative.   Neurological: Negative for dizziness, tingling and headaches.  Psychiatric/Behavioral: Negative for depression. The patient does not have insomnia.     Past Medical History:  Diagnosis Date  . Arthritis   . Diabetes mellitus ([place])    type II ; diagnosed 2006  . Gout   . Hyperlipidemia   . Hypertension    diagnosed 2006  . Inguinal hernia    Past Surgical History:  Procedure Laterality  Date  . INGUINAL HERNIA REPAIR Right 04/01/2014   Procedure: LAPAROSCOPIC RIGHT  INGUINAL HERNIA REPAIR WITH MESH;  Surgeon: Ralene Ok, MD;  Location: [facility];  Service: General;  Laterality: Right;   Social History:   reports that he quit smoking about 10 years ago. His smoking use included cigarettes. He has a 40.00 pack-year smoking history. He has never used smokeless  tobacco. He reports current alcohol use of about 3.0 standard drinks of alcohol per week. He reports that he does not use drugs.  Family History  Problem Relation Age of Onset  . Diabetes Mother   . Hypertension Mother   . Lung cancer Mother   . Diabetes Sister   . Hypertension Sister   . Heart disease Father   . Diabetes Father   . Hypertension Father   . Colon cancer Maternal Grandfather     Medications: Patient's Medications  New Prescriptions   No medications on file  Previous Medications   ACCU-CHEK FASTCLIX LANCETS MISC    1 each by Other route daily. Dx: E11.65   ALLOPURINOL (ZYLOPRIM) 100 MG TABLET    Take 2 tablets (200 mg total) by mouth daily.   AMLODIPINE (NORVASC) 10 MG TABLET    TAKE ONE TABLET BY MOUTH EVERY EVENING   ASPIRIN EC 81 MG TABLET    Take 1 tablet (81 mg total) by mouth daily.   ATENOLOL (TENORMIN) 50 MG TABLET    TAKE ONE TABLET BY MOUTH EVERY MORNING   GLIPIZIDE (GLUCOTROL) 5 MG TABLET    Take 5 mg by mouth daily before breakfast.   GLUCOSE BLOOD (ACCU-CHEK GUIDE) TEST STRIP    1 each by Other route daily. Dx: E11.65   HYDROCORTISONE CREAM 1 %    Apply 1 application topically once as needed for itching.   LISINOPRIL (ZESTRIL) 40 MG TABLET    TAKE ONE TABLET BY MOUTH EVERY MORNING   METFORMIN (GLUCOPHAGE-XR) 500 MG 24 HR TABLET    Take 2 tablets (1,000 mg total) by mouth 2 (two) times daily. E11.9   NAPROXEN SODIUM (ALEVE) 220 MG TABLET    Take 220 mg daily as needed by mouth.   PIOGLITAZONE (ACTOS) 30 MG TABLET    TAKE ONE TABLET BY MOUTH DAILY   ROSUVASTATIN (CRESTOR) 10 MG TABLET    Take 1 tablet (10 mg total) by mouth daily.   TETRAHYDROZOLINE HCL (VISINE EXTRA OP)    Place 2 drops into both eyes once as needed (dry eyes.).  Modified Medications   No medications on file  Discontinued Medications   GLIPIZIDE (GLUCOTROL) 5 MG TABLET    TAKE 1/2 TABLET BY MOUTH DAILY BEFORE BREAKFAST    Physical Exam:  There were no vitals filed for this  visit. There is no height or weight on file to calculate BMI. Wt Readings from Last 3 Encounters:  02/26/19 241 lb (109.3 kg)  01/22/19 243 lb (110.2 kg)  07/24/18 244 lb (110.7 kg)      Labs reviewed: Basic Metabolic Panel: Recent Labs    10/25/18 0850 01/26/19 0855 05/23/19 0836  NA 139 142 141  K 4.0 3.6 3.0*  CL 102 104 101  CO2 27 26 23   GLUCOSE 191* 178* 198*  BUN 12 12 11   CREATININE 0.96 0.92 1.18  CALCIUM 9.6 9.5 9.7   Liver Function Tests: Recent Labs    07/18/18 0921 10/25/18 0850 01/26/19 0855  AST 18 19 17   ALT 18 18 16   BILITOT 0.5 0.7 0.5  PROT 6.9 7.0 6.9   No results for input(s): LIPASE, AMYLASE in the last 8760 hours. No results for input(s): AMMONIA in the last 8760 hours. CBC: Recent Labs    07/18/18 0921  WBC 10.0  NEUTROABS 5,040  HGB 14.8  HCT 41.6  MCV 91.2  PLT 228   Lipid Panel: Recent Labs    10/25/18 0850 01/26/19 0855 05/23/19 0836  CHOL 129 137 178  HDL 48 49 67  LDLCALC 52 54 74  TRIG 250* 289* 308*  CHOLHDL 2.7 2.8 2.7   TSH: No results for input(s): TSH in the last 8760 hours. A1C: Lab Results  Component Value Date   HGBA1C 8.3 (H) 05/23/2019     Assessment/Plan 1. Type 2 diabetes mellitus with hyperglycemia, without long-term current use of insulin ([place]) -educated A1c was 8.3 however pt repeatedly states "I do not believe that, the lab is wrong" - Ambulatory referral to Endocrinology for another option and diabetic management since he does not think our lab values are correct. -he states metformin did nothing so he stopped taking and reports he will not take this medication -will increase glipizide as he has been tolerating well without hypoglycemia.  - discussed with the patient the pathophysiology of diabetes and the natural progression of the disease.  -stressed the importance of lifestyle changes including diet and exercise. -discussed complications associated with diabetes including retinopathy,  nephropathy, neuropathy as well as increased risk of cardiovascular disease. We went over the benefit seen with glycemic control.  - glipiZIDE (GLUCOTROL) 5 MG tablet; Take 1 tablet (5 mg total) by mouth 2 (two) times daily before a meal.  Dispense: 60 tablet; Refill: 0  2. Mixed hyperlipidemia -reports compliance with crestor, triglycerides remain elevated related to uncontrolled diabetes, encouraged dietary modification and proper control of diabetes.  3. Hypokalemia -potassium of 3.0. discussed the importance of potassium in the body and without correct potassium he could experience arrhythmias that could be lethal. Pt expressed understanding but declined follow up lab or treatment. States "I will fix this on my own" and would not agree to follow up lab.   4. Noncompliance with medication regimen --pt is adamant that our lab is wrong. "I do not trust anything that comes out of your office" "the lab is giving you wrong information" pt states that "nothing is wrong with his body" despite advise given for diabetes, gout, hypokalemia and follow up on polyps. It was explained patient that it is important for him to trust the providers that he is seeing and their recommendations. Advised that we can recommend another PCP if he would like. He declines follow up on hypokalemia  5. Chronic gout without tophus, unspecified cause, unspecified site Uric acid elevated at 8.9, pt reports he does not "believe that" number as his body feels good and he is without pain and therefore does not need medication. Educated on the adverse effects of elevated uric acid level over time but the pt states "I hear you but I am fine"  6. Essential hypertension Does not give reading during telephone visit. Reports "it is normal" elevated in office at last visits but pt reports "those readings are not correct" may be some degree of white coat syndrome but will not give actual number of blood pressure reading at home. Encouraged  to continue medication with dietary modifications.    7. Personal history of adenomatous colonic polyp -reports that he is not going anywhere now for anything extra due to [place]. Explained the use  of PPE and cleaning procedures in place to keep everyone safe. Pt declines wanting to set up any follow up at this time.   Next appt: 3 months with lab prior. As he was not agreeable to any follow up sooner than this.  Carlos American. Harle Battiest  [facility] [phone]     Virtual Visit via Telephone Note  I connected with pt on 05/31/19 at  3:15 PM EST by telephone and verified that I am speaking with the correct person using two identifiers.  Location: Patient: home Provider: office   I discussed the limitations, risks, security and privacy concerns of performing an evaluation and management service by telephone and the availability of in person appointments. I also discussed with the patient that there may be a patient responsible charge related to this service. The patient expressed understanding and agreed to proceed.   I discussed the assessment and treatment plan with the patient. The patient was provided an opportunity to ask questions and all were answered. The patient agreed with the plan and demonstrated an understanding of the instructions.   The patient was advised to call back or seek an in-person evaluation if the symptoms worsen or if the condition fails to improve as anticipated.  I provided 24 minutes of non-face-to-face time during this encounter.  Carlos American. Harle Battiest Avs printed and mailed

## 2019-06-18 ENCOUNTER — Other Ambulatory Visit: Payer: Self-pay | Admitting: *Deleted

## 2019-06-18 ENCOUNTER — Other Ambulatory Visit: Payer: Self-pay | Admitting: Nurse Practitioner

## 2019-06-18 DIAGNOSIS — I10 Essential (primary) hypertension: Secondary | ICD-10-CM

## 2019-06-18 DIAGNOSIS — E1165 Type 2 diabetes mellitus with hyperglycemia: Secondary | ICD-10-CM

## 2019-06-18 MED ORDER — GLIPIZIDE 5 MG PO TABS: 5.0000 mg | ORAL_TABLET | Freq: Two times a day (BID) | ORAL | 1 refills | Status: DC

## 2019-06-18 NOTE — Telephone Encounter (Signed)
[employer] Francis King 

## 2019-06-18 NOTE — Telephone Encounter (Signed)
rx sent to pharmacy by e-script  

## 2019-06-19 ENCOUNTER — Other Ambulatory Visit: Payer: Self-pay

## 2019-06-19 ENCOUNTER — Ambulatory Visit (INDEPENDENT_AMBULATORY_CARE_PROVIDER_SITE_OTHER): Payer: Medicare Other | Admitting: Internal Medicine

## 2019-06-19 ENCOUNTER — Encounter: Payer: Self-pay | Admitting: Internal Medicine

## 2019-06-19 VITALS — BP 162/78 | HR 118 | Temp 98.8°F | Ht 70.0 in | Wt 229.0 lb

## 2019-06-19 DIAGNOSIS — E785 Hyperlipidemia, unspecified: Secondary | ICD-10-CM

## 2019-06-19 DIAGNOSIS — E1165 Type 2 diabetes mellitus with hyperglycemia: Secondary | ICD-10-CM

## 2019-06-19 DIAGNOSIS — E118 Type 2 diabetes mellitus with unspecified complications: Secondary | ICD-10-CM | POA: Diagnosis not present

## 2019-06-19 MED ORDER — GLIPIZIDE 5 MG PO TABS: 10.0000 mg | ORAL_TABLET | Freq: Two times a day (BID) | ORAL | 3 refills | Status: DC

## 2019-06-19 MED ORDER — PIOGLITAZONE HCL 30 MG PO TABS: 30.0000 mg | ORAL_TABLET | Freq: Every day | ORAL | 3 refills | Status: AC

## 2019-06-19 NOTE — Patient Instructions (Signed)
-   Increase Glipizide 5 mg, take 2 tablet before Breakfast and Supper - Continue Actos (pioglitazone ) 30 mg daily     - Check sugar before breakfast and supper   - Choose healthy, lower carb lower calorie snacks: toss salad, cooked vegetables, cottage cheese, peanut butter, low fat cheese / string cheese, lower sodium deli meat, tuna salad or chicken salad     HOW TO TREAT LOW BLOOD SUGARS (Blood sugar LESS THAN 70 MG/DL)  Please follow the RULE OF 15 for the treatment of hypoglycemia treatment (when your (blood sugars are less than 70 mg/dL)    STEP 1: Take 15 grams of carbohydrates when your blood sugar is low, which includes:   3-4 GLUCOSE TABS  OR  3-4 OZ OF JUICE OR REGULAR SODA OR  ONE TUBE OF GLUCOSE GEL     STEP 2: RECHECK blood sugar in 15 MINUTES STEP 3: If your blood sugar is still low at the 15 minute recheck --> then, go back to STEP 1 and treat AGAIN with another 15 grams of carbohydrates.

## 2019-06-19 NOTE — Progress Notes (Signed)
Name: Curtis Allison  MRN/ DOB: [id number], 1951-06-01   Age/ Sex: 69 y.o., male    PCP: Lauree Chandler, NP   Reason for Endocrinology Evaluation: Type 2 Diabetes Mellitus     Date of Initial Endocrinology Visit: 06/19/2019     PATIENT IDENTIFIER: [name] is a 69 y.o. male with a past medical history of T2DM, HTN and Dyslipidemia. The patient presented for initial endocrinology clinic visit on 06/19/2019 for consultative assistance with his diabetes management.    HPI: Mr. Cheeseman was    Diagnosed with DM in 2017 Prior Medications tried/Intolerance: Jardiance- Itching , metformin - bad taste in mouth  Currently checking blood sugars 1 x / day,  before breakfast   Hypoglycemia episodes : no  Hemoglobin A1c has ranged from 7.5% in 2019, peaking at 10.0% in 2018. Patient required assistance for hypoglycemia: no Patient has required hospitalization within the last 1 year from hyper or hypoglycemia: no  In terms of diet, the patient eats 2 meals a day, snacks in the middle of the day, drinks sugar sweetened beverages.    HOME DIABETES REGIMEN: Glipizide 5 mg BID Pioglitazone 30 mg daily    Statin: yes ACE-I/ARB: yes Prior Diabetic Education: no    METER DOWNLOAD SUMMARY: Date range evaluated: 12/7-06/19/2019 Fingerstick Blood Glucose Tests = 6 Average Number Tests/Day = 0.2 Overall Mean FS Glucose = 147 Standard Deviation = 10.1  BG Ranges: Low = 130 High = 157   Hypoglycemic Events/30 Days: BG < 50 = 0 Episodes of symptomatic severe hypoglycemia = 0   DIABETIC COMPLICATIONS: Microvascular complications:    Denies: CKD, retinopathy , neuropathy   Last eye exam: Completed 8/ 2020  Macrovascular complications:    Denies: CAD, PVD, CVA   PAST HISTORY: Past Medical History:  Past Medical History:  Diagnosis Date  . Arthritis   . Diabetes mellitus ([place])    type II ; diagnosed 2006  . Gout   . Hyperlipidemia   . Hypertension    diagnosed  2006  . Inguinal hernia    Past Surgical History:  Past Surgical History:  Procedure Laterality Date  . INGUINAL HERNIA REPAIR Right 04/01/2014   Procedure: LAPAROSCOPIC RIGHT  INGUINAL HERNIA REPAIR WITH MESH;  Surgeon: Ralene Ok, MD;  Location: [facility];  Service: General;  Laterality: Right;      Social History:  reports that he quit smoking about 10 years ago. His smoking use included cigarettes. He has a 40.00 pack-year smoking history. He has never used smokeless tobacco. He reports current alcohol use of about 3.0 standard drinks of alcohol per week. He reports that he does not use drugs. Family History:  Family History  Problem Relation Age of Onset  . Diabetes Mother   . Hypertension Mother   . Lung cancer Mother   . Diabetes Sister   . Hypertension Sister   . Heart disease Father   . Diabetes Father   . Hypertension Father   . Colon cancer Maternal Grandfather      HOME MEDICATIONS: Allergies as of 06/19/2019      Reactions   Penicillins Itching   Jardiance [empagliflozin] Itching   Increased urination/penile rash      Medication List       Accurate as of June 19, 2019  9:07 AM. If you have any questions, ask your nurse or doctor.        Accu-Chek FastClix Lancets Misc 1 each by Other route daily. Dx: E11.65  Accu-Chek Guide test strip Generic drug: glucose blood 1 each by Other route daily. Dx: E11.65   allopurinol 100 MG tablet Commonly known as: ZYLOPRIM Take 2 tablets (200 mg total) by mouth daily.   amLODipine 10 MG tablet Commonly known as: NORVASC TAKE ONE TABLET BY MOUTH EVERY EVENING   aspirin EC 81 MG tablet Take 1 tablet (81 mg total) by mouth daily.   atenolol 50 MG tablet Commonly known as: TENORMIN TAKE ONE TABLET BY MOUTH EVERY MORNING   glipiZIDE 5 MG tablet Commonly known as: GLUCOTROL Take 1 tablet (5 mg total) by mouth 2 (two) times daily before a meal.   hydrocortisone cream 1 % Apply 1 application topically once  as needed for itching.   lisinopril 40 MG tablet Commonly known as: ZESTRIL TAKE ONE TABLET BY MOUTH EVERY MORNING   naproxen sodium 220 MG tablet Commonly known as: ALEVE Take 220 mg daily as needed by mouth.   pioglitazone 30 MG tablet Commonly known as: ACTOS TAKE ONE TABLET BY MOUTH DAILY   Potassium 95 MG Tabs Take by mouth.   rosuvastatin 10 MG tablet Commonly known as: CRESTOR Take 1 tablet (10 mg total) by mouth daily.   VISINE EXTRA OP Place 2 drops into both eyes once as needed (dry eyes.).        ALLERGIES: Allergies  Allergen Reactions  . Penicillins Itching  . Jardiance [Empagliflozin] Itching    Increased urination/penile rash     REVIEW OF SYSTEMS: A comprehensive ROS was conducted with the patient and is negative except as per HPI and below:  Review of Systems  Gastrointestinal: Negative for diarrhea and nausea.  Genitourinary: Positive for frequency.  Neurological: Negative for tingling and tremors.  Endo/Heme/Allergies: Positive for polydipsia.      OBJECTIVE:   VITAL SIGNS: BP (!) 162/78 (BP Location: Left Arm, Patient Position: Sitting, Cuff Size: Large)   Pulse (!) 118   Temp 98.8 F (37.1 C)   Ht 5\' 10"  (1.778 m)   Wt 229 lb (103.9 kg)   SpO2 98%   BMI 32.86 kg/m    PHYSICAL EXAM:  General: Pt appears well and is in NAD  HEENT: Head: Unremarkable with good dentition. Oropharynx clear without exudate.  Eyes: External eye exam normal without stare, lid lag or exophthalmos.  EOM intact.  Neck: General: Supple without adenopathy or carotid bruits. Thyroid: Thyroid size normal.  No goiter or nodules appreciated. No thyroid bruit.  Lungs: Clear with good BS bilat with no rales, rhonchi, or wheezes  Heart: RRR with normal S1 and S2 and no gallops; no murmurs; no rub  Abdomen: Normoactive bowel sounds, soft, nontender, without masses or organomegaly palpable  Extremities:  Lower extremities - No pretibial edema.   Skin: Normal  texture and temperature to palpation.   Neuro: MS is good with appropriate affect, pt is alert and Ox3    DM foot exam: 06/18/2018  The skin of the feet is intact without sores or ulcerations. The pedal pulses are 2+ on right and 2+ on left. The sensation is intact to a screening 5.07, 10 gram monofilament bilaterally   DATA REVIEWED:  Lab Results  Component Value Date   HGBA1C 8.3 (H) 05/23/2019   HGBA1C 8.3 (H) 01/26/2019   HGBA1C 8.2 (H) 10/25/2018   Lab Results  Component Value Date   MICROALBUR 2.8 11/29/2017   LDLCALC 74 05/23/2019   CREATININE 1.18 05/23/2019   Lab Results  Component Value Date   MICRALBCREAT 26  11/29/2017    Lab Results  Component Value Date   CHOL 178 05/23/2019   HDL 67 05/23/2019   LDLCALC 74 05/23/2019   TRIG 308 (H) 05/23/2019   CHOLHDL 2.7 05/23/2019        ASSESSMENT / PLAN / RECOMMENDATIONS:   1) Type 2  Diabetes Mellitus, Sub-OPtimally controlled, Without complications - Most recent A1c of 8.3 %. Goal A1c < 7.0 %.    Plan: GENERAL:  Discussed the importance of diet and exercise in reaching optimal glycemic control  Advised the patient to avoid sugar-sweetened beverages and snacks when possible  I have encouraged him to continue with glucose checks. We discussed increasing glipizide, we also discussed the risk of hypoglycemia and weight gain   MEDICATIONS:  Increase Glipizide 5 mg, 2 tabs Before breakfast and supper  Continue Pioglitazone 30 mg daily   EDUCATION / INSTRUCTIONS:  BG monitoring instructions: Patient is instructed to check his blood sugars 2 times a day, fasting and supper time.  Call [facility] if: BG persistently < 70 or > 300. . I reviewed the Rule of 15 for the treatment of hypoglycemia in detail with the patient. Literature supplied.   2) Diabetic complications:   Eye: Does not have known diabetic retinopathy.   Neuro/ Feet: Does not have known diabetic peripheral neuropathy.   Renal: Patient does not have known baseline CKD. He is  on an ACEI/ARB at present.Check urine albumin/creatinine ratio yearly starting at time of diagnosis.    3) Lipids: Patient is on rosuvaststain 10 mg daily. LDL at goal at 74 mg/dL    4) Hypertension: He is above goal of < 140/90 mmHg. Will defer to PCP.   F/U in 3 months     Signed electronically by: Mack Guise, MD  [facility]  [facility] [address] [place], [place] [address] Phone: [phone] FAX: [phone]   CC: Lauree Chandler, NP [place] [address] Phone: [phone]  Fax: [phone]    Return to Endocrinology clinic as below: Future Appointments  Date Time Provider [place]  08/24/2019  8:30 AM [facility] None  08/27/2019  2:45 PM Lauree Chandler, NP [facility] None  01/24/2020  9:30 AM Lauree Chandler, NP [facility] None

## 2019-07-02 ENCOUNTER — Other Ambulatory Visit: Payer: Self-pay | Admitting: Nurse Practitioner

## 2019-07-02 DIAGNOSIS — E782 Mixed hyperlipidemia: Secondary | ICD-10-CM

## 2019-07-02 DIAGNOSIS — E1165 Type 2 diabetes mellitus with hyperglycemia: Secondary | ICD-10-CM

## 2019-07-25 ENCOUNTER — Other Ambulatory Visit: Payer: Self-pay | Admitting: Nurse Practitioner

## 2019-07-25 DIAGNOSIS — I10 Essential (primary) hypertension: Secondary | ICD-10-CM

## 2019-08-24 ENCOUNTER — Other Ambulatory Visit: Payer: Self-pay

## 2019-08-24 ENCOUNTER — Other Ambulatory Visit: Payer: Medicare Other

## 2019-08-24 DIAGNOSIS — E782 Mixed hyperlipidemia: Secondary | ICD-10-CM

## 2019-08-24 DIAGNOSIS — E1165 Type 2 diabetes mellitus with hyperglycemia: Secondary | ICD-10-CM

## 2019-08-25 LAB — COMPLETE METABOLIC PANEL WITH GFR
AG Ratio: 1.6 (calc) (ref 1.0–2.5)
ALT: 11 U/L (ref 9–46)
AST: 17 U/L (ref 10–35)
Albumin: 4 g/dL (ref 3.6–5.1)
Alkaline phosphatase (APISO): 64 U/L (ref 35–144)
BUN: 10 mg/dL (ref 7–25)
CO2: 20 mmol/L (ref 20–32)
Calcium: 9 mg/dL (ref 8.6–10.3)
Chloride: 101 mmol/L (ref 98–110)
Creat: 1.09 mg/dL (ref 0.70–1.25)
GFR, Est African American: 81 mL/min/{1.73_m2} (ref >=60)
GFR, Est Non African American: 70 mL/min/{1.73_m2} (ref >=60)
Globulin: 2.5 g/dL (calc) (ref 1.9–3.7)
Glucose, Bld: 200 mg/dL — ABNORMAL HIGH (ref 65–99)
Potassium: 3 mmol/L — ABNORMAL LOW (ref 3.5–5.3)
Sodium: 140 mmol/L (ref 135–146)
Total Bilirubin: 0.7 mg/dL (ref 0.2–1.2)
Total Protein: 6.5 g/dL (ref 6.1–8.1)

## 2019-08-25 LAB — CBC WITH DIFFERENTIAL/PLATELET
Absolute Monocytes: 418 cells/uL (ref 200–950)
Basophils Absolute: 62 cells/uL (ref 0–200)
Basophils Relative: 0.7 %
Eosinophils Absolute: 98 cells/uL (ref 15–500)
Eosinophils Relative: 1.1 %
HCT: 39.1 % (ref 38.5–50.0)
Hemoglobin: 13.7 g/dL (ref 13.2–17.1)
Lymphs Abs: 3320 cells/uL (ref 850–3900)
MCH: 34.9 pg — ABNORMAL HIGH (ref 27.0–33.0)
MCHC: 35 g/dL (ref 32.0–36.0)
MCV: 99.7 fL (ref 80.0–100.0)
MPV: 11.1 fL (ref 7.5–12.5)
Monocytes Relative: 4.7 %
Neutro Abs: 5002 cells/uL (ref 1500–7800)
Neutrophils Relative %: 56.2 %
Platelets: 191 10*3/uL (ref 140–400)
RBC: 3.92 10*6/uL — ABNORMAL LOW (ref 4.20–5.80)
RDW: 14.3 % (ref 11.0–15.0)
Total Lymphocyte: 37.3 %
WBC: 8.9 10*3/uL (ref 3.8–10.8)

## 2019-08-25 LAB — HEMOGLOBIN A1C
Hgb A1c MFr Bld: 7.8 % of total Hgb — ABNORMAL HIGH (ref <5.7)
Mean Plasma Glucose: 177 (calc)
eAG (mmol/L): 9.8 (calc)

## 2019-08-25 LAB — LIPID PANEL
Cholesterol: 140 mg/dL (ref <200)
HDL: 62 mg/dL (ref >=40)
LDL Cholesterol (Calc): 46 mg/dL (calc)
Non-HDL Cholesterol (Calc): 78 mg/dL (calc) (ref <130)
Total CHOL/HDL Ratio: 2.3 (calc) (ref <5.0)
Triglycerides: 306 mg/dL — ABNORMAL HIGH (ref <150)

## 2019-08-27 ENCOUNTER — Ambulatory Visit (INDEPENDENT_AMBULATORY_CARE_PROVIDER_SITE_OTHER): Payer: Medicare Other | Admitting: Nurse Practitioner

## 2019-08-27 ENCOUNTER — Other Ambulatory Visit: Payer: Self-pay

## 2019-08-27 ENCOUNTER — Encounter: Payer: Self-pay | Admitting: Nurse Practitioner

## 2019-08-27 DIAGNOSIS — Z86010 Personal history of colonic polyps: Secondary | ICD-10-CM | POA: Diagnosis not present

## 2019-08-27 DIAGNOSIS — M1A9XX0 Chronic gout, unspecified, without tophus (tophi): Secondary | ICD-10-CM

## 2019-08-27 DIAGNOSIS — E876 Hypokalemia: Secondary | ICD-10-CM

## 2019-08-27 DIAGNOSIS — E1169 Type 2 diabetes mellitus with other specified complication: Secondary | ICD-10-CM | POA: Diagnosis not present

## 2019-08-27 DIAGNOSIS — E785 Hyperlipidemia, unspecified: Secondary | ICD-10-CM

## 2019-08-27 DIAGNOSIS — F1010 Alcohol abuse, uncomplicated: Secondary | ICD-10-CM

## 2019-08-27 DIAGNOSIS — E1165 Type 2 diabetes mellitus with hyperglycemia: Secondary | ICD-10-CM

## 2019-08-27 DIAGNOSIS — I10 Essential (primary) hypertension: Secondary | ICD-10-CM

## 2019-08-27 MED ORDER — ACCU-CHEK GUIDE VI STRP: 1.0000 | ORAL_STRIP | Freq: Every day | 11 refills | Status: AC

## 2019-08-27 NOTE — Progress Notes (Signed)
This service is provided via telemedicine  No vital signs collected/recorded due to the encounter was a telemedicine visit.   Location of patient (ex: home, work):  Home  Patient consents to a telephone visit:  Yes  Location of the provider (ex: office, home):  [facility], Office   Name of any referring provider:  N/A  Names of all persons participating in the telemedicine service and their role in the encounter:  S.Chrae B/CMA, Sherrie Mustache, NP, and Patient   Time spent on call:  7 min with medical assistant      Careteam: Patient Care Team: Lauree Chandler, NP as PCP - General (Geriatric Medicine) Crista Luria, MD as Attending Physician (Dermatology) Poudyal, Ritesh, OD (Optometry)  PLACE OF SERVICE:  [facility]  Advanced Directive information    Allergies  Allergen Reactions  . Penicillins Itching  . Jardiance [Empagliflozin] Itching    Increased urination/penile rash    Chief Complaint  Patient presents with  . Medical Management of Chronic Issues    3 month follow-up and discuss labs (copy printed and to be mailed with AVS). Telehealth/video visit   . Quality Metric Gaps    Discuss need for foot exam, eye exam and colonoscopy  . Best Practice Recommendations    Discuss Hep C screening recommendation  . Immunizations    Refused all vaccine updates at this time.   . Medication Management    Discuss Allopurinol, only takes as needed vs as prescribed      HPI: Patient is a 69 y.o. male for follow up via telephone visit- virtual visit was attempted but due to poor connection unable to continue with visit and therefore telephone visit was done.   Hypokalemia- ongoing, reports he bought some OTC supplement however potassium level still 3.0. "ive got to die from something"  Admits to drinking 3-4 shot of alcohol daily. "brandy"  DM- now going to endocrinologist- "my sugar does not look that bad to me" "I am not going to take my blood sugar  twice a day"  Hyperlipidemia- triglycerides elevated- "I know all about it, probably related to the brandy" Continues on crestor 10 mg daily.   Hypertension- 138/80 on lisinopril 40 mg daily, norvasc 10 daily, atenolol 50 mg daily  Reports no gout flare- taking allopurinol.   Has not gotten COVID vaccine- "not ready to get into that yet"  Hx of colonic polyps- plans to look into this "once the pandemic eases up"  Plans to follow up with a male doctor- has things he can not discuss with a male-  Again states "I do not trust the lab there"   Review of Systems:  Review of Systems  Constitutional: Positive for weight loss (~210lbs). Negative for chills and fever.  HENT: Negative for tinnitus.   Respiratory: Negative for cough, sputum production and shortness of breath.   Cardiovascular: Negative for chest pain, palpitations and leg swelling.  Gastrointestinal: Negative for abdominal pain, constipation, diarrhea and heartburn.  Genitourinary: Negative for dysuria, frequency and urgency.  Musculoskeletal: Negative for back pain, joint pain and myalgias.  Skin: Negative.   Neurological: Negative for dizziness, tingling and headaches.  Psychiatric/Behavioral: Negative for depression and memory loss. The patient does not have insomnia.     Past Medical History:  Diagnosis Date  . Arthritis   . Diabetes mellitus ([place])    type II ; diagnosed 2006  . Gout   . Hyperlipidemia   . Hypertension    diagnosed 2006  .  Inguinal hernia    Past Surgical History:  Procedure Laterality Date  . INGUINAL HERNIA REPAIR Right 04/01/2014   Procedure: LAPAROSCOPIC RIGHT  INGUINAL HERNIA REPAIR WITH MESH;  Surgeon: Ralene Ok, MD;  Location: [facility];  Service: General;  Laterality: Right;   Social History:   reports that he quit smoking about 10 years ago. His smoking use included cigarettes. He has a 40.00 pack-year smoking history. He has never used smokeless tobacco. He reports current  alcohol use of about 3.0 standard drinks of alcohol per week. He reports that he does not use drugs.  Family History  Problem Relation Age of Onset  . Diabetes Mother   . Hypertension Mother   . Lung cancer Mother   . Diabetes Sister   . Hypertension Sister   . Heart disease Father   . Diabetes Father   . Hypertension Father   . Colon cancer Maternal Grandfather     Medications: Patient's Medications  New Prescriptions   No medications on file  Previous Medications   ACCU-CHEK FASTCLIX LANCETS MISC    1 each by Other route daily. Dx: E11.65   ALLOPURINOL (ZYLOPRIM) 100 MG TABLET    Take 2 tablets (200 mg total) by mouth daily.   AMLODIPINE (NORVASC) 10 MG TABLET    TAKE ONE TABLET BY MOUTH EVERY EVENING   ASPIRIN EC 81 MG TABLET    Take 1 tablet (81 mg total) by mouth daily.   ATENOLOL (TENORMIN) 50 MG TABLET    TAKE ONE TABLET BY MOUTH EVERY MORNING   GLIPIZIDE (GLUCOTROL) 5 MG TABLET    Take 2 tablets (10 mg total) by mouth 2 (two) times daily before a meal.   GLUCOSE BLOOD (ACCU-CHEK GUIDE) TEST STRIP    1 each by Other route daily. Dx: E11.65   HYDROCORTISONE CREAM 1 %    Apply 1 application topically once as needed for itching.   LISINOPRIL (ZESTRIL) 40 MG TABLET    TAKE ONE TABLET BY MOUTH EVERY MORNING   NAPROXEN SODIUM (ALEVE) 220 MG TABLET    Take 220 mg daily as needed by mouth.   PIOGLITAZONE (ACTOS) 30 MG TABLET    Take 1 tablet (30 mg total) by mouth daily.   POTASSIUM 95 MG TABS    Take by mouth.   ROSUVASTATIN (CRESTOR) 10 MG TABLET    Take 1 tablet (10 mg total) by mouth daily.   TETRAHYDROZOLINE HCL (VISINE EXTRA OP)    Place 2 drops into both eyes once as needed (dry eyes.).  Modified Medications   No medications on file  Discontinued Medications   No medications on file    Physical Exam:  There were no vitals filed for this visit. There is no height or weight on file to calculate BMI. Wt Readings from Last 3 Encounters:  06/19/19 229 lb (103.9 kg)   02/26/19 241 lb (109.3 kg)  01/22/19 243 lb (110.2 kg)      Labs reviewed: Basic Metabolic Panel: Recent Labs    01/26/19 0855 05/23/19 0836 08/24/19 0846  NA 142 141 140  K 3.6 3.0* 3.0*  CL 104 101 101  CO2 26 23 20   GLUCOSE 178* 198* 200*  BUN 12 11 10   CREATININE 0.92 1.18 1.09  CALCIUM 9.5 9.7 9.0   Liver Function Tests: Recent Labs    10/25/18 0850 01/26/19 0855 08/24/19 0846  AST 19 17 17   ALT 18 16 11   BILITOT 0.7 0.5 0.7  PROT 7.0 6.9 6.5  No results for input(s): LIPASE, AMYLASE in the last 8760 hours. No results for input(s): AMMONIA in the last 8760 hours. CBC: Recent Labs    08/24/19 0846  WBC 8.9  NEUTROABS 5,002  HGB 13.7  HCT 39.1  MCV 99.7  PLT 191   Lipid Panel: Recent Labs    01/26/19 0855 05/23/19 0836 08/24/19 0846  CHOL 137 178 140  HDL 49 67 62  LDLCALC 54 74 46  TRIG 289* 308* 306*  CHOLHDL 2.8 2.7 2.3   TSH: No results for input(s): TSH in the last 8760 hours. A1C: Lab Results  Component Value Date   HGBA1C 7.8 (H) 08/24/2019     Assessment/Plan 1. Type 2 diabetes mellitus with hyperglycemia, without long-term current use of insulin (HCC) -now following with endocrinology in regards to diabetes.  -Encouraged dietary compliance, routine foot care/monitoring and to keep up with diabetic eye exams through ophthalmology  - glucose blood (ACCU-CHEK GUIDE) test strip; 1 each by Other route daily. Dx: E11.65  Dispense: 100 each; Refill: 11  2. Hyperlipidemia associated with type 2 diabetes mellitus ([place]) -continues with elevated triglycerides likely related to ETOh and uncontrolled diabetes. Education provided on this today in detail but pt reports "lab must be wrong" continues on crestor 10 mg daily   3. Essential hypertension - reports control at home continues on lisinopril 40 mg, amlodipine 10 mg daily and atenolol 50 mg daily   4. Personal history of adenomatous colonic polyp -states he is aware and plans to  schedule "once ready"  5. Hypokalemia Declines supplements. Reports "I have to die some way" when educated about risk of lethal arhythmia related to electrolyte imbalance. States he is taking an OTC supplement and will not take another prescription.   6. Chronic gout without tophus, unspecified cause, unspecified site Stable, encouraged to use allopurinol daily however only willing to take PRN, "too many pills"   7. ETOH abuse Admits to drinking 4 shots of brandy daily, likely contributing to hypokalemia, hypertriglycerides and elevated uric acid. Also at risk for many more medical issues due to this. Encouraged to cut back and cessation   Next appt: 3 months, however pt reports he plans to seek medical care at a different office, reports his preference is a male provider. States he has one in mind.  Curtis Allison. Harle Battiest  [facility] [phone]    Virtual Visit via Telephone Note  I connected with pt on 08/28/19 at  2:45 PM EDT by telephone and verified that I am speaking with the correct person using two identifiers.  Location: Patient: home Provider: office    I discussed the limitations, risks, security and privacy concerns of performing an evaluation and management service by telephone and the availability of in person appointments. I also discussed with the patient that there may be a patient responsible charge related to this service. The patient expressed understanding and agreed to proceed.   I discussed the assessment and treatment plan with the patient. The patient was provided an opportunity to ask questions and all were answered. The patient agreed with the plan and demonstrated an understanding of the instructions.   The patient was advised to call back or seek an in-person evaluation if the symptoms worsen or if the condition fails to improve as anticipated.  I provided 30 minutes of non-face-to-face time during this encounter.   Curtis Allison. Harle Battiest Avs printed and mailed

## 2019-08-27 NOTE — Progress Notes (Signed)
Discussed results with pt. He reports "I do not trust your lab" when given results for low potassium, elevated blood sugar and triglycerides. He states "I do not wish to take any potassium supplements and will not come back for retesting"

## 2019-08-28 NOTE — Patient Instructions (Addendum)
Recommended to decrease amount of alcohol just drink daily, would recommend slowly cutting back and ultimately stopping  Would recommend you taking your allopurinol daily to reduce uric acid Alcohol is also adding to the increase in uric acid level.     Gout  Gout is a condition that causes painful swelling of the joints. Gout is a type of inflammation of the joints (arthritis). This condition is caused by having too much uric acid in the body. Uric acid is a chemical that forms when the body breaks down substances called purines. Purines are important for building body proteins. When the body has too much uric acid, sharp crystals can form and build up inside the joints. This causes pain and swelling. Gout attacks can happen quickly and may be very painful (acute gout). Over time, the attacks can affect more joints and become more frequent (chronic gout). Gout can also cause uric acid to build up under the skin and inside the kidneys. What are the causes? This condition is caused by too much uric acid in your blood. This can happen because:  Your kidneys do not remove enough uric acid from your blood. This is the most common cause.  Your body makes too much uric acid. This can happen with some cancers and cancer treatments. It can also occur if your body is breaking down too many red blood cells (hemolytic anemia).  You eat too many foods that are high in purines. These foods include organ meats and some seafood. Alcohol, especially beer, is also high in purines. A gout attack may be triggered by trauma or stress. What increases the risk? You are more likely to develop this condition if you:  Have a family history of gout.  Are male and middle-aged.  Are male and have gone through menopause.  Are obese.  Frequently drink alcohol, especially beer.  Are dehydrated.  Lose weight too quickly.  Have an organ transplant.  Have lead poisoning.  Take certain medicines, including  aspirin, cyclosporine, diuretics, levodopa, and niacin.  Have kidney disease.  Have a skin condition called psoriasis. What are the signs or symptoms? An attack of acute gout happens quickly. It usually occurs in just one joint. The most common place is the big toe. Attacks often start at night. Other joints that may be affected include joints of the feet, ankle, knee, fingers, wrist, or elbow. Symptoms of this condition may include:  Severe pain.  Warmth.  Swelling.  Stiffness.  Tenderness. The affected joint may be very painful to touch.  Shiny, red, or purple skin.  Chills and fever. Chronic gout may cause symptoms more frequently. More joints may be involved. You may also have white or yellow lumps (tophi) on your hands or feet or in other areas near your joints. How is this diagnosed? This condition is diagnosed based on your symptoms, medical history, and physical exam. You may have tests, such as:  Blood tests to measure uric acid levels.  Removal of joint fluid with a thin needle (aspiration) to look for uric acid crystals.  X-rays to look for joint damage. How is this treated? Treatment for this condition has two phases: treating an acute attack and preventing future attacks. Acute gout treatment may include medicines to reduce pain and swelling, including:  NSAIDs.  Steroids. These are strong anti-inflammatory medicines that can be taken by mouth (orally) or injected into a joint.  Colchicine. This medicine relieves pain and swelling when it is taken soon after an attack.  It can be given by mouth or through an IV. Preventive treatment may include:  Daily use of smaller doses of NSAIDs or colchicine.  Use of a medicine that reduces uric acid levels in your blood.  Changes to your diet. You may need to see a dietitian about what to eat and drink to prevent gout. Follow these instructions at home: During a gout attack   If directed, put ice on the affected  area: ? Put ice in a plastic bag. ? Place a towel between your skin and the bag. ? Leave the ice on for 20 minutes, 2-3 times a day.  Raise (elevate) the affected joint above the level of your heart as often as possible.  Rest the joint as much as possible. If the affected joint is in your leg, you may be given crutches to use.  Follow instructions from your health care provider about eating or drinking restrictions. Avoiding future gout attacks  Follow a low-purine diet as told by your dietitian or health care provider. Avoid foods and drinks that are high in purines, including liver, kidney, anchovies, asparagus, herring, mushrooms, mussels, and beer.  Maintain a healthy weight or lose weight if you are overweight. If you want to lose weight, talk with your health care provider. It is important that you do not lose weight too quickly.  Start or maintain an exercise program as told by your health care provider. Eating and drinking  Drink enough fluids to keep your urine pale yellow.  If you drink alcohol: ? Limit how much you use to:  0-1 drink a day for women.  0-2 drinks a day for men. ? Be aware of how much alcohol is in your drink. In the [place] one drink equals one 12 oz bottle of beer (355 mL) one 5 oz glass of wine (148 mL), or one 1 oz glass of hard liquor (44 mL). General instructions  Take over-the-counter and prescription medicines only as told by your health care provider.  Do not drive or use heavy machinery while taking prescription pain medicine.  Return to your normal activities as told by your health care provider. Ask your health care provider what activities are safe for you.  Keep all follow-up visits as told by your health care provider. This is important. Contact a health care provider if you have:  Another gout attack.  Continuing symptoms of a gout attack after 10 days of treatment.  Side effects from your medicines.  Chills or a fever.  Burning  pain when you urinate.  Pain in your lower back or belly. Get help right away if you:  Have severe or uncontrolled pain.  Cannot urinate. Summary  Gout is painful swelling of the joints caused by inflammation.  The most common site of pain is the big toe, but it can affect other joints in the body.  Medicines and dietary changes can help to prevent and treat gout attacks. This information is not intended to replace advice given to you by your health care provider. Make sure you discuss any questions you have with your health care provider. Document Revised: 12/21/2017 Document Reviewed: 12/21/2017 Elsevier Patient Education  [place].

## 2019-09-19 ENCOUNTER — Encounter: Payer: Self-pay | Admitting: Internal Medicine

## 2019-09-19 ENCOUNTER — Ambulatory Visit: Payer: Medicare Other | Admitting: Internal Medicine

## 2019-09-19 ENCOUNTER — Other Ambulatory Visit: Payer: Self-pay

## 2019-09-19 VITALS — BP 158/78 | HR 111 | Temp 97.9°F | Ht 70.0 in | Wt 222.0 lb

## 2019-09-19 DIAGNOSIS — E1165 Type 2 diabetes mellitus with hyperglycemia: Secondary | ICD-10-CM | POA: Diagnosis not present

## 2019-09-19 DIAGNOSIS — N62 Hypertrophy of breast: Secondary | ICD-10-CM | POA: Diagnosis not present

## 2019-09-19 DIAGNOSIS — R05 Cough: Secondary | ICD-10-CM | POA: Diagnosis not present

## 2019-09-19 DIAGNOSIS — R059 Cough, unspecified: Secondary | ICD-10-CM

## 2019-09-19 MED ORDER — GLIPIZIDE 5 MG PO TABS: 10.0000 mg | ORAL_TABLET | Freq: Two times a day (BID) | ORAL | 3 refills | Status: AC

## 2019-09-19 NOTE — Progress Notes (Signed)
Name: Daid Tresch  Age/ Sex: 69 y.o., male   MRN/ DOB: [id number], 1950-12-28     PCP: Lauree Chandler, NP   Reason for Endocrinology Evaluation: Type 2 Diabetes Mellitus  Initial Endocrine Consultative Visit: 06/19/2019    PATIENT IDENTIFIER: Curtis Allison is a 69 y.o. male with a past medical history of T2DM, HTN and Dyslipidemia. The patient has followed with Endocrinology clinic since 06/19/2019 for consultative assistance with management of his diabetes.  DIABETIC HISTORY:  Curtis Allison was diagnosed with T2DM in 2017 . Intolerant to Jardiance - itching. Metformin- Bad taste.  His hemoglobin A1c has ranged from 7.5% in 2019, peaking at 10.0% in 2018.   On his initial visit to our clinic his A1c was 8.3%. He was on Pioglitazone and Glipizide, which we adjusted the glipizide dose.    SUBJECTIVE:   During the last visit (06/19/2019): A1c 8.3% . Increased Glipizide, continued pioglitasone.   Today (09/19/2019): Curtis Allison is here for a follow up on diabetes management.  He checks his blood sugars occasionally . The patient has not had hypoglycemic episodes since the last clinic visit. Otherwise, the patient has not required any recent emergency interventions for hypoglycemia and has not had recent hospitalizations secondary to hyper or hypoglycemic episodes.   Pt is c/o Right breast  tenderness for the past 3-4 weeks. Denies any nipple discharge . Attributes this to glipizide He is sexually active, denies erectile dysfunction  Denies OTC supplements  Denies headaches or visual problems  He is having cough for the past month     HOME DIABETES REGIMEN:  Glipizide 5 mg, 2 tabs before Breakfast and Supper  Pioglitazone 30 mg daily      METER DOWNLOAD SUMMARY: Date range evaluated: 3/25-09/19/2019 Fingerstick Blood Glucose Tests = 5 Average Number Tests/Day = 0.4 Overall Mean FS Glucose = 127   BG Ranges: Low = 111 High = 151   Hypoglycemic Events/30 Days: BG < 50  = 0 Episodes of symptomatic severe hypoglycemia = 0   DIABETIC COMPLICATIONS: Microvascular complications:    Denies: CKD, retinopathy , neuropathy   Last eye exam: Completed 8/ 2020  Macrovascular complications:    Denies: CAD, PVD, CVA   HISTORY:  Past Medical History:  Past Medical History:  Diagnosis Date  . Arthritis   . Diabetes mellitus ([place])    type II ; diagnosed 2006  . Gout   . Hyperlipidemia   . Hypertension    diagnosed 2006  . Inguinal hernia    Past Surgical History:  Past Surgical History:  Procedure Laterality Date  . INGUINAL HERNIA REPAIR Right 04/01/2014   Procedure: LAPAROSCOPIC RIGHT  INGUINAL HERNIA REPAIR WITH MESH;  Surgeon: Ralene Ok, MD;  Location: [facility];  Service: General;  Laterality: Right;    Social History:  reports that he quit smoking about 10 years ago. His smoking use included cigarettes. He has a 40.00 pack-year smoking history. He has never used smokeless tobacco. He reports current alcohol use of about 3.0 standard drinks of alcohol per week. He reports that he does not use drugs. Family History:  Family History  Problem Relation Age of Onset  . Diabetes Mother   . Hypertension Mother   . Lung cancer Mother   . Diabetes Sister   . Hypertension Sister   . Heart disease Father   . Diabetes Father   . Hypertension Father   . Colon cancer Maternal Grandfather      HOME MEDICATIONS: Allergies as  of 09/19/2019      Reactions   Penicillins Itching   Jardiance [empagliflozin] Itching   Increased urination/penile rash      Medication List       Accurate as of September 19, 2019  4:32 PM. If you have any questions, ask your nurse or doctor.        Accu-Chek FastClix Lancets Misc 1 each by Other route daily. Dx: E11.65   Accu-Chek Guide test strip Generic drug: glucose blood 1 each by Other route daily. Dx: E11.65   allopurinol 100 MG tablet Commonly known as: ZYLOPRIM Take 2 tablets (200 mg total) by mouth  daily.   amLODipine 10 MG tablet Commonly known as: NORVASC TAKE ONE TABLET BY MOUTH EVERY EVENING   aspirin EC 81 MG tablet Take 1 tablet (81 mg total) by mouth daily.   atenolol 50 MG tablet Commonly known as: TENORMIN TAKE ONE TABLET BY MOUTH EVERY MORNING   glipiZIDE 5 MG tablet Commonly known as: GLUCOTROL Take 2 tablets (10 mg total) by mouth 2 (two) times daily before a meal.   hydrocortisone cream 1 % Apply 1 application topically once as needed for itching.   lisinopril 40 MG tablet Commonly known as: ZESTRIL TAKE ONE TABLET BY MOUTH EVERY MORNING   naproxen sodium 220 MG tablet Commonly known as: ALEVE Take 220 mg daily as needed by mouth.   pioglitazone 30 MG tablet Commonly known as: ACTOS Take 1 tablet (30 mg total) by mouth daily.   Potassium 95 MG Tabs Take by mouth.   rosuvastatin 10 MG tablet Commonly known as: CRESTOR Take 1 tablet (10 mg total) by mouth daily.   VISINE EXTRA OP Place 2 drops into both eyes once as needed (dry eyes.).        OBJECTIVE:   Vital Signs: BP (!) 158/78 (BP Location: Left Arm, Patient Position: Sitting, Cuff Size: Large)   Pulse (!) 111   Temp 97.9 F (36.6 C)   Ht 5\' 10"  (1.778 m)   Wt 222 lb (100.7 kg)   SpO2 98%   BMI 31.85 kg/m   Wt Readings from Last 3 Encounters:  09/19/19 222 lb (100.7 kg)  06/19/19 229 lb (103.9 kg)  02/26/19 241 lb (109.3 kg)     Exam: General: Pt appears well and is in NAD  Breast : 2.5 inches gynecomastia on the right. Normal left chest  exam   Lungs: Clear with good BS bilat with no rales, rhonchi, or wheezes  Heart: RRR with normal S1 and S2 and no gallops; no murmurs; no rub  Abdomen: Normoactive bowel sounds, soft, nontender, without masses or organomegaly palpable  Genital : Pt declined   Extremities: No pretibial edema. No tremor.   Neuro: MS is good with appropriate affect, pt is alert and Ox3      DM foot exam: 06/18/2018  The skin of the feet is intact without  sores or ulcerations. The pedal pulses are 2+ on right and 2+ on left. The sensation is intact to a screening 5.07, 10 gram monofilament bilaterally   DATA REVIEWED:  Lab Results  Component Value Date   HGBA1C 7.8 (H) 08/24/2019   HGBA1C 8.3 (H) 05/23/2019   HGBA1C 8.3 (H) 01/26/2019   Lab Results  Component Value Date   MICROALBUR 2.8 11/29/2017   LDLCALC 46 08/24/2019   CREATININE 1.09 08/24/2019   Lab Results  Component Value Date   MICRALBCREAT 26 11/29/2017     Lab Results  Component Value  Date   CHOL 140 08/24/2019   HDL 62 08/24/2019   LDLCALC 46 08/24/2019   TRIG 306 (H) 08/24/2019   CHOLHDL 2.3 08/24/2019         ASSESSMENT / PLAN / RECOMMENDATIONS:   1) Type 2 Diabetes Mellitus, Sub-Optimally ontrolled, Without complications - Most recent A1c of 7.8 %. Goal A1c < 7.0 %.   -In review of his glucose meter download today his glucose readings have been optimal.  Patient would like to reduce his glipizide dose, he is under the impression that his gynecomastia is close by glipizide he also attributes the cough to glipizide, I explained to him that glipizide is not contributing to neither of these problems and I have strongly recommended that he continues on them as his glucose readings have been optimal. -Patient is adamant about reducing his glipizide dose, he believes that he has made enough lifestyle changes to be able to do without them, we have agreed to give him a 74-month period, and recheck on the next visit. -I again reminded the patient of the complications of uncontrolled diabetes such as blindness, kidney damage, and neuropathy.  MEDICATIONS:  Glipizide 5 mg, 1 tablet twice daily  Continue pioglitazone 30 mg daily  EDUCATION / INSTRUCTIONS:  BG monitoring instructions: Patient is instructed to check his blood sugars 2 times a day, before breakfast and supper  Call [facility] if: BG persistently < 70 or > 300. . I reviewed the Rule  of 15 for the treatment of hypoglycemia in detail with the patient. Literature supplied.    2) Diabetic complications:   Eye: Does not have known diabetic retinopathy.   Neuro/ Feet: Does not have known diabetic peripheral neuropathy .   Renal: Patient does not have known baseline CKD. He   is on an ACEI/ARB at present.    3) Cough:  -We discussed differential diagnosis of bronchitis, allergic cough, or as a side effect of lisinopril. -I have advised him to discuss this further with his PCP   4) Gynecomastia :   -Patient has a right sided gynecomastia approximately 2.5 inches in diameter.  Patient will need testosterone, prolactin, LH checks. -We discussed the importance of having testosterone check in the morning in the fasting status. -Patient will stop by the lab to have the above labs drawn.  F/U in 3 months   Signed electronically by: Mack Guise, MD  [facility] Endocrinology  [facility] [address] [place], [place] [address] Phone: [phone] FAX: [phone]   CC: Lauree Chandler, NP [place] [address] Phone: [phone]  Fax: [phone]  Return to Endocrinology clinic as below: Future Appointments  Date Time Provider [place]  10/03/2019  8:30 AM [facility] None  12/19/2019  2:40 PM Aalijah Mims, Melanie Crazier, MD [facility] None  01/24/2020  9:30 AM Lauree Chandler, NP [facility] None

## 2019-09-19 NOTE — Patient Instructions (Signed)
-    Glipizide 5 mg,twice daily  - Continue Actos (pioglitazone ) 30 mg daily  - As discussed during your visit this is not something I agree with, but we can see how things are in 3 months    HOW TO TREAT LOW BLOOD SUGARS (Blood sugar LESS THAN 70 MG/DL)  Please follow the RULE OF 15 for the treatment of hypoglycemia treatment (when your (blood sugars are less than 70 mg/dL)    STEP 1: Take 15 grams of carbohydrates when your blood sugar is low, which includes:   3-4 GLUCOSE TABS  OR  3-4 OZ OF JUICE OR REGULAR SODA OR  ONE TUBE OF GLUCOSE GEL     STEP 2: RECHECK blood sugar in 15 MINUTES STEP 3: If your blood sugar is still low at the 15 minute recheck --> then, go back to STEP 1 and treat AGAIN with another 15 grams of carbohydrates.

## 2019-09-20 ENCOUNTER — Other Ambulatory Visit: Payer: Self-pay | Admitting: Nurse Practitioner

## 2019-09-20 DIAGNOSIS — I10 Essential (primary) hypertension: Secondary | ICD-10-CM

## 2019-10-03 ENCOUNTER — Other Ambulatory Visit: Payer: Medicare Other

## 2019-10-11 ENCOUNTER — Telehealth: Payer: Self-pay | Admitting: Internal Medicine

## 2019-10-11 DIAGNOSIS — N62 Hypertrophy of breast: Secondary | ICD-10-CM

## 2019-10-11 NOTE — Telephone Encounter (Signed)
Curtis Allison,  When I saw Curtis Allison last I diagnosed him with Gynecomastia, I explained to him the importance of doing labs to check on hormones.  He was supposed to do it shortly after I saw him last time, but I just saw that its been pushed back 2 months. Please schedule his for an 8 AM fasting labs.  Also , I am going to order a mammogram on him due to his gynecomastia ( I was going to wait on the labs first ) but since its been 3 weeks I am going to order it, please schedule that at the breast center.      Thank you    Abby Nena Jordan, MD  [facility] Endocrinology  [facility] [address] [place], [place] [address] Phone: [phone] FAX: [phone]

## 2019-10-11 NOTE — Telephone Encounter (Signed)
Curtis Allison,    I meant to send you a message but I accidentally closed it, can you please look in to the previous message?   Thanks

## 2019-10-12 ENCOUNTER — Other Ambulatory Visit: Payer: Self-pay | Admitting: Internal Medicine

## 2019-10-12 ENCOUNTER — Encounter: Payer: Self-pay | Admitting: Internal Medicine

## 2019-10-12 DIAGNOSIS — N62 Hypertrophy of breast: Secondary | ICD-10-CM

## 2019-10-12 NOTE — Telephone Encounter (Signed)
Letter sent.

## 2019-10-12 NOTE — Telephone Encounter (Signed)
Scheduled pt for 10/15/2019 @ 10:10 for mammogram and rt breast ultrasound(scheduler stated it is protocol and placed order) called pt to inform and schedule labs but pt vm is full so unable to leave vm.

## 2019-10-15 ENCOUNTER — Other Ambulatory Visit: Payer: Medicare Other

## 2019-10-31 ENCOUNTER — Other Ambulatory Visit: Payer: Self-pay | Admitting: Nurse Practitioner

## 2019-10-31 DIAGNOSIS — I10 Essential (primary) hypertension: Secondary | ICD-10-CM

## 2019-12-07 ENCOUNTER — Telehealth: Payer: Self-pay | Admitting: Internal Medicine

## 2019-12-07 NOTE — Telephone Encounter (Signed)
Routed to me by mistake 

## 2019-12-07 NOTE — Telephone Encounter (Signed)
Fyi.

## 2019-12-07 NOTE — Telephone Encounter (Signed)
Patient called to cancel his appointments at our office. Patient states he does not want to be seen at our office anymore. Patient states he will be getting treatment at a different office.

## 2019-12-11 ENCOUNTER — Other Ambulatory Visit: Payer: Medicare Other

## 2019-12-19 ENCOUNTER — Ambulatory Visit: Payer: Medicare Other | Admitting: Internal Medicine

## 2019-12-20 ENCOUNTER — Other Ambulatory Visit: Payer: Self-pay | Admitting: Nurse Practitioner

## 2019-12-20 DIAGNOSIS — I10 Essential (primary) hypertension: Secondary | ICD-10-CM

## 2019-12-20 DIAGNOSIS — E1169 Type 2 diabetes mellitus with other specified complication: Secondary | ICD-10-CM

## 2019-12-20 MED ORDER — ROSUVASTATIN CALCIUM 10 MG PO TABS: 10.0000 mg | ORAL_TABLET | Freq: Every day | ORAL | 1 refills | Status: AC

## 2019-12-20 MED ORDER — ATENOLOL 50 MG PO TABS: 50.0000 mg | ORAL_TABLET | Freq: Every morning | ORAL | 1 refills | Status: AC

## 2019-12-20 MED ORDER — LISINOPRIL 40 MG PO TABS: 40.0000 mg | ORAL_TABLET | Freq: Every morning | ORAL | 1 refills | Status: AC

## 2020-01-24 ENCOUNTER — Telehealth: Payer: Self-pay

## 2020-01-24 ENCOUNTER — Encounter: Payer: Self-pay | Admitting: Nurse Practitioner

## 2020-01-24 ENCOUNTER — Ambulatory Visit (INDEPENDENT_AMBULATORY_CARE_PROVIDER_SITE_OTHER): Payer: Medicare Other | Admitting: Nurse Practitioner

## 2020-01-24 ENCOUNTER — Other Ambulatory Visit: Payer: Self-pay

## 2020-01-24 DIAGNOSIS — Z0000 Encounter for general adult medical examination without abnormal findings: Secondary | ICD-10-CM

## 2020-01-24 NOTE — Telephone Encounter (Signed)
A user error has taken place.

## 2020-01-24 NOTE — Progress Notes (Signed)
This service is provided via telemedicine  No vital signs collected/recorded due to the encounter was a telemedicine visit.   Location of patient (ex: home, work):  Home  Patient consents to a telephone visit:  Yes, see encounter dated 01/24/2020  Location of the provider (ex: office, home):  [place]  Name of any referring provider:  N/A  Names of all persons participating in the telemedicine service and their role in the encounter:  Sherrie Mustache, Nurse Practitioner, Carroll Kinds, CMA, and patient.   Time spent on call:  6 minutes with medical assistant

## 2020-01-24 NOTE — Patient Instructions (Signed)
Curtis Allison , Thank you for taking time to come for your Medicare Wellness Visit. I appreciate your ongoing commitment to your health goals. Please review the following plan we discussed and let me know if I can assist you in the future.   Screening recommendations/referrals: Colonoscopy: OVER-DUE-please call and make appt for this to be completed with Dr Carlean Purl.  Recommended yearly ophthalmology/optometry visit for glaucoma screening and checkup Recommended yearly dental visit for hygiene and checkup  Vaccinations: Influenza vaccine- you have declined.  Pneumococcal vaccine -you have declined.  Tdap vaccine -you have declined Shingles vaccine - you have declined    Advanced directives: recommended to complete.   Conditions/risks identified: risk of complications from flu, shingles, pneumonia and COVID due to not having vaccine. Cardiovascular risk, obesity, advance age. complicantions from diabetes.  At risk for colon cancer due to hx of polyps.    Next appointment: need to make appt for routine follow up and lab within the next month AWV in 1 year  Preventive Care 16 Years and Older, Male Preventive care refers to lifestyle choices and visits with your health care provider that can promote health and wellness. What does preventive care include?  A yearly physical exam. This is also called an annual well check.  Dental exams once or twice a year.  Routine eye exams. Ask your health care provider how often you should have your eyes checked.  Personal lifestyle choices, including:  Daily care of your teeth and gums.  Regular physical activity.  Eating a healthy diet.  Avoiding tobacco and drug use.  Limiting alcohol use.  Practicing safe sex.  Taking low doses of aspirin every day.  Taking vitamin and mineral supplements as recommended by your health care provider. What happens during an annual well check? The services and screenings done by your health care provider  during your annual well check will depend on your age, overall health, lifestyle risk factors, and family history of disease. Counseling  Your health care provider may ask you questions about your:  Alcohol use.  Tobacco use.  Drug use.  Emotional well-being.  Home and relationship well-being.  Sexual activity.  Eating habits.  History of falls.  Memory and ability to understand (cognition).  Work and work Statistician. Screening  You may have the following tests or measurements:  Height, weight, and BMI.  Blood pressure.  Lipid and cholesterol levels. These may be checked every 5 years, or more frequently if you are over 27 years old.  Skin check.  Lung cancer screening. You may have this screening every year starting at age 2 if you have a 30-pack-year history of smoking and currently smoke or have quit within the past 15 years.  Fecal occult blood test (FOBT) of the stool. You may have this test every year starting at age 71.  Flexible sigmoidoscopy or colonoscopy. You may have a sigmoidoscopy every 5 years or a colonoscopy every 10 years starting at age 48.  Prostate cancer screening. Recommendations will vary depending on your family history and other risks.  Hepatitis C blood test.  Hepatitis B blood test.  Sexually transmitted disease (STD) testing.  Diabetes screening. This is done by checking your blood sugar (glucose) after you have not eaten for a while (fasting). You may have this done every 1-3 years.  Abdominal aortic aneurysm (AAA) screening. You may need this if you are a current or former smoker.  Osteoporosis. You may be screened starting at age 25 if you are at  high risk. Talk with your health care provider about your test results, treatment options, and if necessary, the need for more tests. Vaccines  Your health care provider may recommend certain vaccines, such as:  Influenza vaccine. This is recommended every year.  Tetanus,  diphtheria, and acellular pertussis (Tdap, Td) vaccine. You may need a Td booster every 10 years.  Zoster vaccine. You may need this after age 64.  Pneumococcal 13-valent conjugate (PCV13) vaccine. One dose is recommended after age 67.  Pneumococcal polysaccharide (PPSV23) vaccine. One dose is recommended after age 15. Talk to your health care provider about which screenings and vaccines you need and how often you need them. This information is not intended to replace advice given to you by your health care provider. Make sure you discuss any questions you have with your health care provider. Document Released: 06/27/2015 Document Revised: 02/18/2016 Document Reviewed: 04/01/2015 Elsevier Interactive Patient Education  2017 [place] Prevention in the Home Falls can cause injuries. They can happen to people of all ages. There are many things you can do to make your home safe and to help prevent falls. What can I do on the outside of my home?  Regularly fix the edges of walkways and driveways and fix any cracks.  Remove anything that might make you trip as you walk through a door, such as a raised step or threshold.  Trim any bushes or trees on the path to your home.  Use bright outdoor lighting.  Clear any walking paths of anything that might make someone trip, such as rocks or tools.  Regularly check to see if handrails are loose or broken. Make sure that both sides of any steps have handrails.  Any raised decks and porches should have guardrails on the edges.  Have any leaves, snow, or ice cleared regularly.  Use sand or salt on walking paths during winter.  Clean up any spills in your garage right away. This includes oil or grease spills. What can I do in the bathroom?  Use night lights.  Install grab bars by the toilet and in the tub and shower. Do not use towel bars as grab bars.  Use non-skid mats or decals in the tub or shower.  If you need to sit down in  the shower, use a plastic, non-slip stool.  Keep the floor dry. Clean up any water that spills on the floor as soon as it happens.  Remove soap buildup in the tub or shower regularly.  Attach bath mats securely with double-sided non-slip rug tape.  Do not have throw rugs and other things on the floor that can make you trip. What can I do in the bedroom?  Use night lights.  Make sure that you have a light by your bed that is easy to reach.  Do not use any sheets or blankets that are too big for your bed. They should not hang down onto the floor.  Have a firm chair that has side arms. You can use this for support while you get dressed.  Do not have throw rugs and other things on the floor that can make you trip. What can I do in the kitchen?  Clean up any spills right away.  Avoid walking on wet floors.  Keep items that you use a lot in easy-to-reach places.  If you need to reach something above you, use a strong step stool that has a grab bar.  Keep electrical cords out of the  way.  Do not use floor polish or wax that makes floors slippery. If you must use wax, use non-skid floor wax.  Do not have throw rugs and other things on the floor that can make you trip. What can I do with my stairs?  Do not leave any items on the stairs.  Make sure that there are handrails on both sides of the stairs and use them. Fix handrails that are broken or loose. Make sure that handrails are as long as the stairways.  Check any carpeting to make sure that it is firmly attached to the stairs. Fix any carpet that is loose or worn.  Avoid having throw rugs at the top or bottom of the stairs. If you do have throw rugs, attach them to the floor with carpet tape.  Make sure that you have a light switch at the top of the stairs and the bottom of the stairs. If you do not have them, ask someone to add them for you. What else can I do to help prevent falls?  Wear shoes that:  Do not have high  heels.  Have rubber bottoms.  Are comfortable and fit you well.  Are closed at the toe. Do not wear sandals.  If you use a stepladder:  Make sure that it is fully opened. Do not climb a closed stepladder.  Make sure that both sides of the stepladder are locked into place.  Ask someone to hold it for you, if possible.  Clearly mark and make sure that you can see:  Any grab bars or handrails.  First and last steps.  Where the edge of each step is.  Use tools that help you move around (mobility aids) if they are needed. These include:  Canes.  Walkers.  Scooters.  Crutches.  Turn on the lights when you go into a dark area. Replace any light bulbs as soon as they burn out.  Set up your furniture so you have a clear path. Avoid moving your furniture around.  If any of your floors are uneven, fix them.  If there are any pets around you, be aware of where they are.  Review your medicines with your doctor. Some medicines can make you feel dizzy. This can increase your chance of falling. Ask your doctor what other things that you can do to help prevent falls. This information is not intended to replace advice given to you by your health care provider. Make sure you discuss any questions you have with your health care provider. Document Released: 03/27/2009 Document Revised: 11/06/2015 Document Reviewed: 07/05/2014 Elsevier Interactive Patient Education  2017 [name].

## 2020-01-24 NOTE — Telephone Encounter (Signed)
Curtis Allison, Curtis Allison are scheduled for a virtual visit with your provider today.    Just as we do with appointments in the office, we must obtain your consent to participate.  Your consent will be active for this visit and any virtual visit you may have with one of our providers in the next 365 days.    If you have a MyChart account, I can also send a copy of this consent to you electronically.  All virtual visits are billed to your insurance company just like a traditional visit in the office.  As this is a virtual visit, video technology does not allow for your provider to perform a traditional examination.  This may limit your provider's ability to fully assess your condition.  If your provider identifies any concerns that need to be evaluated in person or the need to arrange testing such as labs, EKG, etc, we will make arrangements to do so.    Although advances in technology are sophisticated, we cannot ensure that it will always work on either your end or our end.  If the connection with a video visit is poor, we may have to switch to a telephone visit.  With either a video or telephone visit, we are not always able to ensure that we have a secure connection.   I need to obtain your verbal consent now.   Are you willing to proceed with your visit today?   Curtis Allison has provided verbal consent on 01/24/2020 for a virtual visit (video or telephone).   Curtis Allison, [facility] 01/24/2020  8:46 AM

## 2020-01-24 NOTE — Progress Notes (Addendum)
Subjective:   Curtis Allison is a 69 y.o. male who presents for Medicare Annual/Subsequent preventive examination.  Review of Systems           Objective:    There were no vitals filed for this visit. There is no height or weight on file to calculate BMI.  Advanced Directives 01/24/2020 05/30/2019 01/29/2019 01/22/2019 10/27/2018 10/27/2018 07/24/2018  Does Patient Have a Medical Advance Directive? No No No No - Yes No  Does patient want to make changes to medical advance directive? - - - - No - Patient declined No - Patient declined -  Would patient like information on creating a medical advance directive? - No - Patient declined No - Patient declined No - Patient declined - - No - Patient declined    Current Medications (verified) Outpatient Encounter Medications as of 01/24/2020  Medication Sig  . Accu-Chek FastClix Lancets MISC 1 each by Other route daily. Dx: E11.65  . allopurinol (ZYLOPRIM) 100 MG tablet Take 2 tablets (200 mg total) by mouth daily.  Marland Kitchen amLODipine (NORVASC) 10 MG tablet TAKE ONE TABLET BY MOUTH EVERY EVENING  . aspirin EC 81 MG tablet Take 1 tablet (81 mg total) by mouth daily.  Marland Kitchen atenolol (TENORMIN) 50 MG tablet Take 1 tablet (50 mg total) by mouth every morning.  Marland Kitchen glipiZIDE (GLUCOTROL) 5 MG tablet Take 2 tablets (10 mg total) by mouth 2 (two) times daily before a meal.  . glucose blood (ACCU-CHEK GUIDE) test strip 1 each by Other route daily. Dx: E11.65  . hydrocortisone cream 1 % Apply 1 application topically once as needed for itching.  Marland Kitchen lisinopril (ZESTRIL) 40 MG tablet Take 1 tablet (40 mg total) by mouth every morning.  . naproxen sodium (ALEVE) 220 MG tablet Take 220 mg daily as needed by mouth.  . pioglitazone (ACTOS) 30 MG tablet Take 1 tablet (30 mg total) by mouth daily.  . Potassium 95 MG TABS Take by mouth.  . rosuvastatin (CRESTOR) 10 MG tablet Take 1 tablet (10 mg total) by mouth daily.  . Tetrahydrozoline HCl (VISINE EXTRA OP) Place 2 drops into  both eyes once as needed (dry eyes.).  Marland Kitchen triamcinolone cream (KENALOG) 0.1 % Apply topically.   No facility-administered encounter medications on file as of 01/24/2020.    Allergies (verified) Penicillins and Jardiance [empagliflozin]   History: Past Medical History:  Diagnosis Date  . Arthritis   . Diabetes mellitus ([place])    type II ; diagnosed 2006  . Gout   . Hyperlipidemia   . Hypertension    diagnosed 2006  . Inguinal hernia    Past Surgical History:  Procedure Laterality Date  . INGUINAL HERNIA REPAIR Right 04/01/2014   Procedure: LAPAROSCOPIC RIGHT  INGUINAL HERNIA REPAIR WITH MESH;  Surgeon: Ralene Ok, MD;  Location: [facility];  Service: General;  Laterality: Right;   Family History  Problem Relation Age of Onset  . Diabetes Mother   . Hypertension Mother   . Lung cancer Mother   . Diabetes Sister   . Hypertension Sister   . Heart disease Father   . Diabetes Father   . Hypertension Father   . Colon cancer Maternal Grandfather    Social History   Socioeconomic History  . Marital status: Legally Separated    Spouse name: Not on file  . Number of children: Not on file  . Years of education: Not on file  . Highest education level: Not on file  Occupational History  .  Not on file  Tobacco Use  . Smoking status: Former Smoker    Packs/day: 1.00    Years: 40.00    Pack years: 40.00    Types: Cigarettes    Quit date: 01/04/2009    Years since quitting: 11.0  . Smokeless tobacco: Never Used  Vaping Use  . Vaping Use: Never used  Substance and Sexual Activity  . Alcohol use: Yes    Alcohol/week: 3.0 standard drinks    Types: 3 Shots of liquor per week  . Drug use: No    Types: Marijuana    Comment: Used back in the 70's   . Sexual activity: Not Currently  Other Topics Concern  . Not on file  Social History Narrative   Social History      Diet? 40      Do you drink/eat things with caffeine? coffee      Marital status?                   separated                  What year were you married? 1997      Do you live in a house, apartment, assisted living, condo, trailer, etc.? House, apartment, trailer      Is it one or more stories? no      How many persons live in your home? 2      Do you have any pets in your home? (please list) no      Highest level of education completed? 12      Current or past profession: sanitation, [employer]      Do you exercise?                 yes                     Type & how often? Twice a week-sit ups      Advanced Directives      Do you have a living will? no      Do you have a DNR form?                                  If not, do you want to discuss one? no      Do you have signed POA/HPOA for forms? no      Functional Status      Do you have difficulty bathing or dressing yourself? no      Do you have difficulty preparing food or eating? no      Do you have difficulty managing your medications? no      Do you have difficulty managing your finances? no      Do you have difficulty affording your medications? no   Social Determinants of Health   Financial Resource Strain:   . Difficulty of Paying Living Expenses:   Food Insecurity:   . Worried About Charity fundraiser in the Last Year:   . Arboriculturist in the Last Year:   Transportation Needs:   . Film/video editor (Medical):   Marland Kitchen Lack of Transportation (Non-Medical):   Physical Activity:   . Days of Exercise per Week:   . Minutes of Exercise per Session:   Stress:   . Feeling of Stress :   Social Connections:   . Frequency of Communication  with Friends and Family:   . Frequency of Social Gatherings with Friends and Family:   . Attends Religious Services:   . Active Member of Clubs or Organizations:   . Attends Archivist Meetings:   Marland Kitchen Marital Status:     Tobacco Counseling Counseling given: Not Answered   Clinical Intake:                 Diabetic?yes          Activities of Daily Living No flowsheet data found.  Patient Care Team: Lauree Chandler, NP as PCP - General (Geriatric Medicine) Crista Luria, MD as Attending Physician (Dermatology) [place], Ritesh, OD (Optometry)  Indicate any recent Medical Services you may have received from other than [facility] providers in the past year (date may be approximate).     Assessment:   This is a routine wellness examination for [place].  Hearing/Vision screen  Hearing Screening   125Hz  250Hz  500Hz  1000Hz  2000Hz  3000Hz  4000Hz  6000Hz  8000Hz   Right ear:           Left ear:           Comments: Patient states no hearing issues.  Vision Screening Comments: No issues with vision. Patient states he has had eye exam within past year and is scheduled for one this month.  Dietary issues and exercise activities discussed:    Goals    .  diabetes (pt-stated)      Working on getting diabetes under better control with portion control and decrease in sugar      Depression Screen PHQ 2/9 Scores 01/24/2020 08/27/2019 05/30/2019 01/22/2019 10/27/2018 01/18/2018 06/15/2017  PHQ - 2 Score 0 0 0 0 0 0 0    Fall Risk Fall Risk  01/24/2020 08/27/2019 05/30/2019 02/26/2019 01/29/2019  Falls in the past year? 0 0 0 0 0  Number falls in past yr: 0 0 0 0 0  Injury with Fall? 0 0 0 0 0    Any stairs in or around the home? No  If so, are there any without handrails? No  Home free of loose throw rugs in walkways, pet beds, electrical cords, etc? Yes  Adequate lighting in your home to reduce risk of falls? Yes   ASSISTIVE DEVICES UTILIZED TO PREVENT FALLS:  Life alert? No  Use of a cane, walker or w/c? No  Grab bars in the bathroom? Yes  Shower chair or bench in shower? No  Elevated toilet seat or a handicapped toilet? No   TIMED UP AND GO:na Cognitive Function: MMSE - Mini Mental State Exam 01/22/2019 06/15/2017  Orientation to time 5 4  Orientation to Place 4 5  Registration 3 3  Attention/ Calculation 5 5   Recall 2 2  Language- name 2 objects 2 2  Language- repeat 1 1  Language- follow 3 step command 3 3  Language- read & follow direction 1 1  Write a sentence 1 1  Copy design 1 0  Total score 28 27     6CIT Screen 01/24/2020  What Year? 0 points  What month? 0 points  What time? 0 points  Count back from 20 0 points  Months in reverse 0 points  Repeat phrase 4 points  Total Score 4    Immunizations  There is no immunization history on file for this patient.  TDAP status: Due, Education has been provided regarding the importance of this vaccine. Advised may receive this vaccine at local pharmacy or Health Dept. Aware  to provide a copy of the vaccination record if obtained from local pharmacy or Health Dept. Verbalized acceptance and understanding. Flu Vaccine status: Declined, Education has been provided regarding the importance of this vaccine but patient still declined. Advised may receive this vaccine at local pharmacy or Health Dept. Aware to provide a copy of the vaccination record if obtained from local pharmacy or Health Dept. Verbalized acceptance and understanding. Pneumococcal vaccine status: Declined,  Education has been provided regarding the importance of this vaccine but patient still declined. Advised may receive this vaccine at local pharmacy or Health Dept. Aware to provide a copy of the vaccination record if obtained from local pharmacy or Health Dept. Verbalized acceptance and understanding.  Covid-19 vaccine status: Declined, Education has been provided regarding the importance of this vaccine but patient still declined. Advised may receive this vaccine at local pharmacy or Health Dept.or vaccine clinic. Aware to provide a copy of the vaccination record if obtained from local pharmacy or Health Dept. Verbalized acceptance and understanding.  Qualifies for Shingles Vaccine? Yes   Zostavax completed No   Shingrix Completed?: No.    Education has been provided regarding  the importance of this vaccine. Patient has been advised to call insurance company to determine out of pocket expense if they have not yet received this vaccine. Advised may also receive vaccine at local pharmacy or Health Dept. Verbalized acceptance and understanding.  Screening Tests Health Maintenance  Topic Date Due  . Hepatitis C Screening  Never done  . COVID-19 Vaccine (1) Never done  . TETANUS/TDAP  Never done  . COLONOSCOPY  01/20/2017  . PNA vac Low Risk Adult (1 of 2 - PCV13) Never done  . FOOT EXAM  11/26/2018  . OPHTHALMOLOGY EXAM  02/10/2019  . INFLUENZA VACCINE  01/13/2020  . HEMOGLOBIN A1C  02/24/2020    Health Maintenance  Health Maintenance Due  Topic Date Due  . Hepatitis C Screening  Never done  . COVID-19 Vaccine (1) Never done  . TETANUS/TDAP  Never done  . COLONOSCOPY  01/20/2017  . PNA vac Low Risk Adult (1 of 2 - PCV13) Never done  . FOOT EXAM  11/26/2018  . OPHTHALMOLOGY EXAM  02/10/2019  . INFLUENZA VACCINE  01/13/2020    Colorectal cancer screening: Completed 2013. Repeat every 5 years  Lung Cancer Screening: (Low Dose CT Chest recommended if Age 10-80 years, 30 pack-year currently smoking OR have quit w/in 15years.) does qualify.   Lung Cancer Screening Referral: pt decline, he does not wish to have screening of the lungs. "not worried about that right now"  Additional Screening:  Hepatitis C Screening: does qualify; however pt declines.  Vision Screening: Recommended annual ophthalmology exams for early detection of glaucoma and other disorders of the eye. Is the patient up to date with their annual eye exam?  No  Who is the provider or what is the name of the office in which the patient attends annual eye exams? [facility]  If pt is not established with a provider, would they like to be referred to a provider to establish care? No .   Dental Screening: Recommended annual dental exams for proper oral hygiene  Community Resource Referral / Chronic  Care Management: CRR required this visit?  No   CCM required this visit?  No      Plan:     I have personally reviewed and noted the following in the patient's chart:   . Medical and social history . Use of  alcohol, tobacco or illicit drugs  . Current medications and supplements . Functional ability and status . Nutritional status . Physical activity . Advanced directives . List of other physicians . Hospitalizations, surgeries, and ER visits in previous 12 months . Vitals . Screenings to include cognitive, depression, and falls . Referrals and appointments  In addition, I have reviewed and discussed with patient certain preventive protocols, quality metrics, and best practice recommendations. A written personalized care plan for preventive services as well as general preventive health recommendations were provided to patient.     Lauree Chandler, NP   01/24/2020    This service is provided via telemedicine  No vital signs collected/recorded due to the encounter was a telemedicine visit.   Location of patient (ex: home, work):  Home  Patient consents to a telephone visit:  Yes, see encounter dated 01/24/2020  Location of the provider (ex: office, home):  [place]  Name of any referring provider:  N/A  Names of all persons participating in the telemedicine service and their role in the encounter:  Sherrie Mustache, Nurse Practitioner, Carroll Kinds, CMA, and patient.   Time spent on call: 20 minutes

## 2020-04-30 ENCOUNTER — Other Ambulatory Visit: Payer: Self-pay | Admitting: General Practice

## 2020-04-30 DIAGNOSIS — Z136 Encounter for screening for cardiovascular disorders: Secondary | ICD-10-CM

## 2021-03-30 ENCOUNTER — Ambulatory Visit
Admission: RE | Admit: 2021-03-30 | Discharge: 2021-03-30 | Disposition: A | Discharge status: Home / Self Care | Payer: Medicare Other | Source: Ambulatory Visit | Attending: *Deleted | Admitting: *Deleted

## 2021-03-30 ENCOUNTER — Other Ambulatory Visit: Payer: Self-pay | Admitting: *Deleted

## 2021-03-30 DIAGNOSIS — M25511 Pain in right shoulder: Secondary | ICD-10-CM

## 2022-03-23 IMAGING — CR DG SHOULDER 2+V*R*
3 series · 3 of 3 positions shown · non-contrast
Comparison: None.

CLINICAL DATA: Right shoulder pain, uncertain chronicity, initial
encounter

EXAM:
RIGHT SHOULDER - 2+ VIEW

[w shoulder ap internal righ]
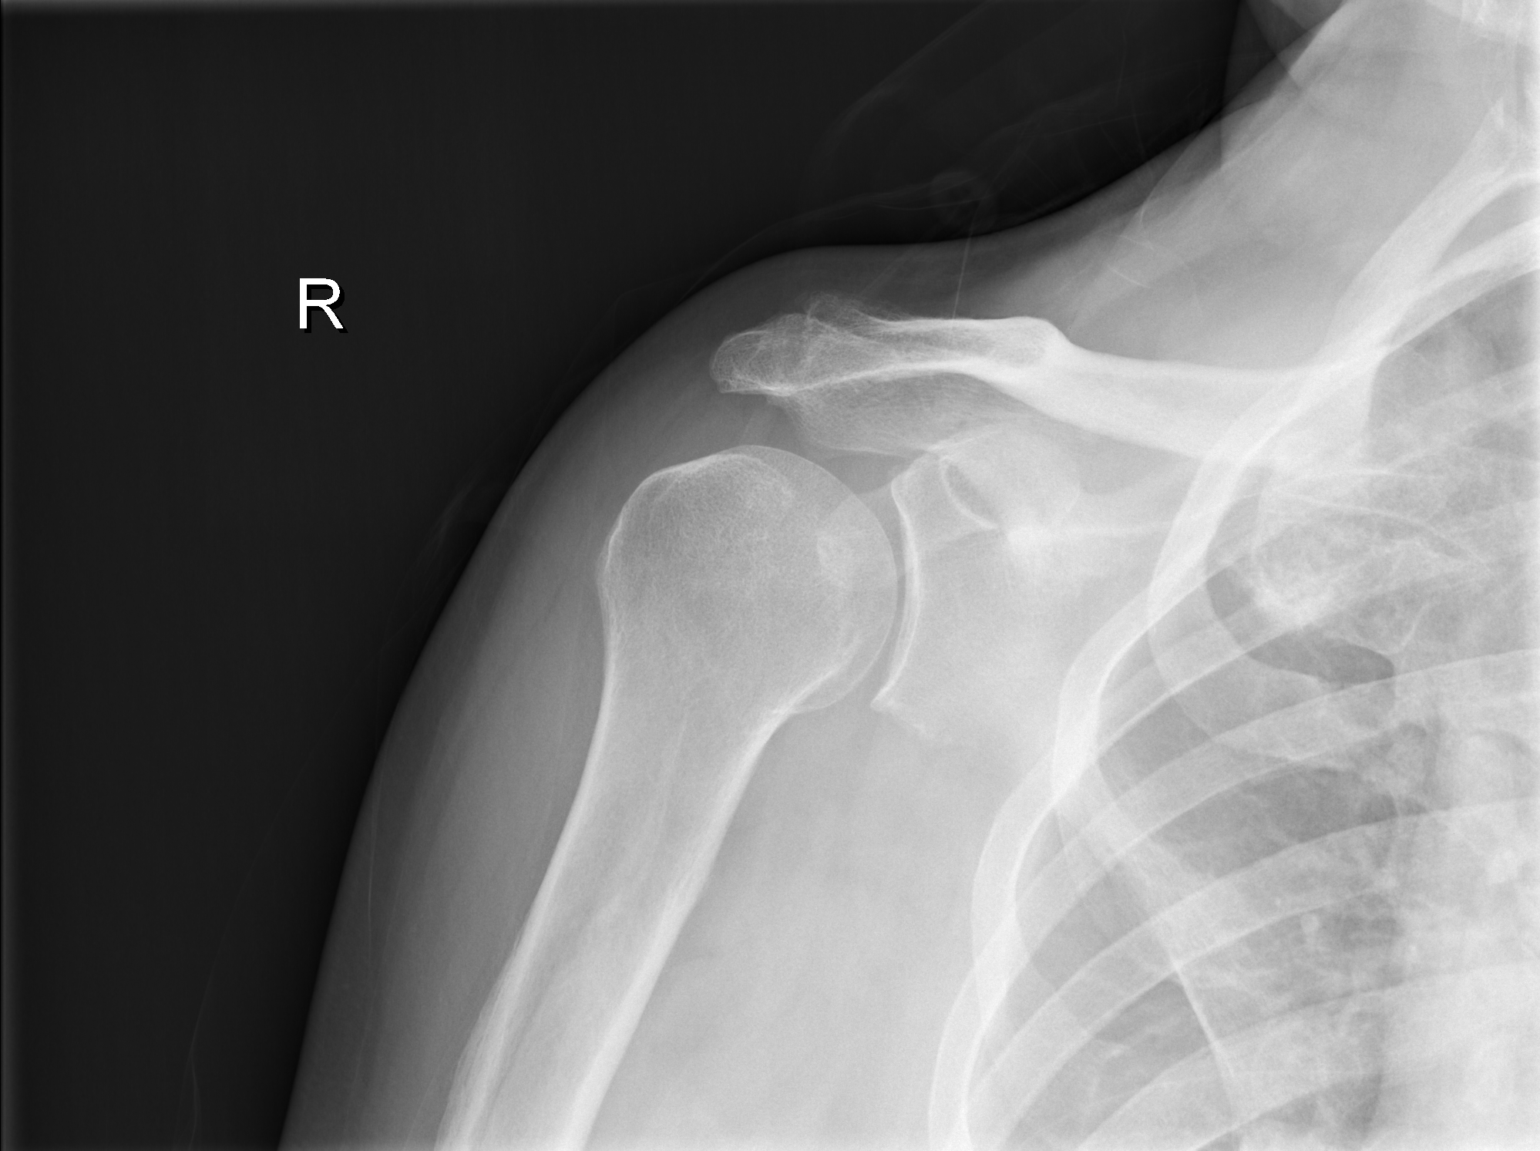

[w shoulder y view right]
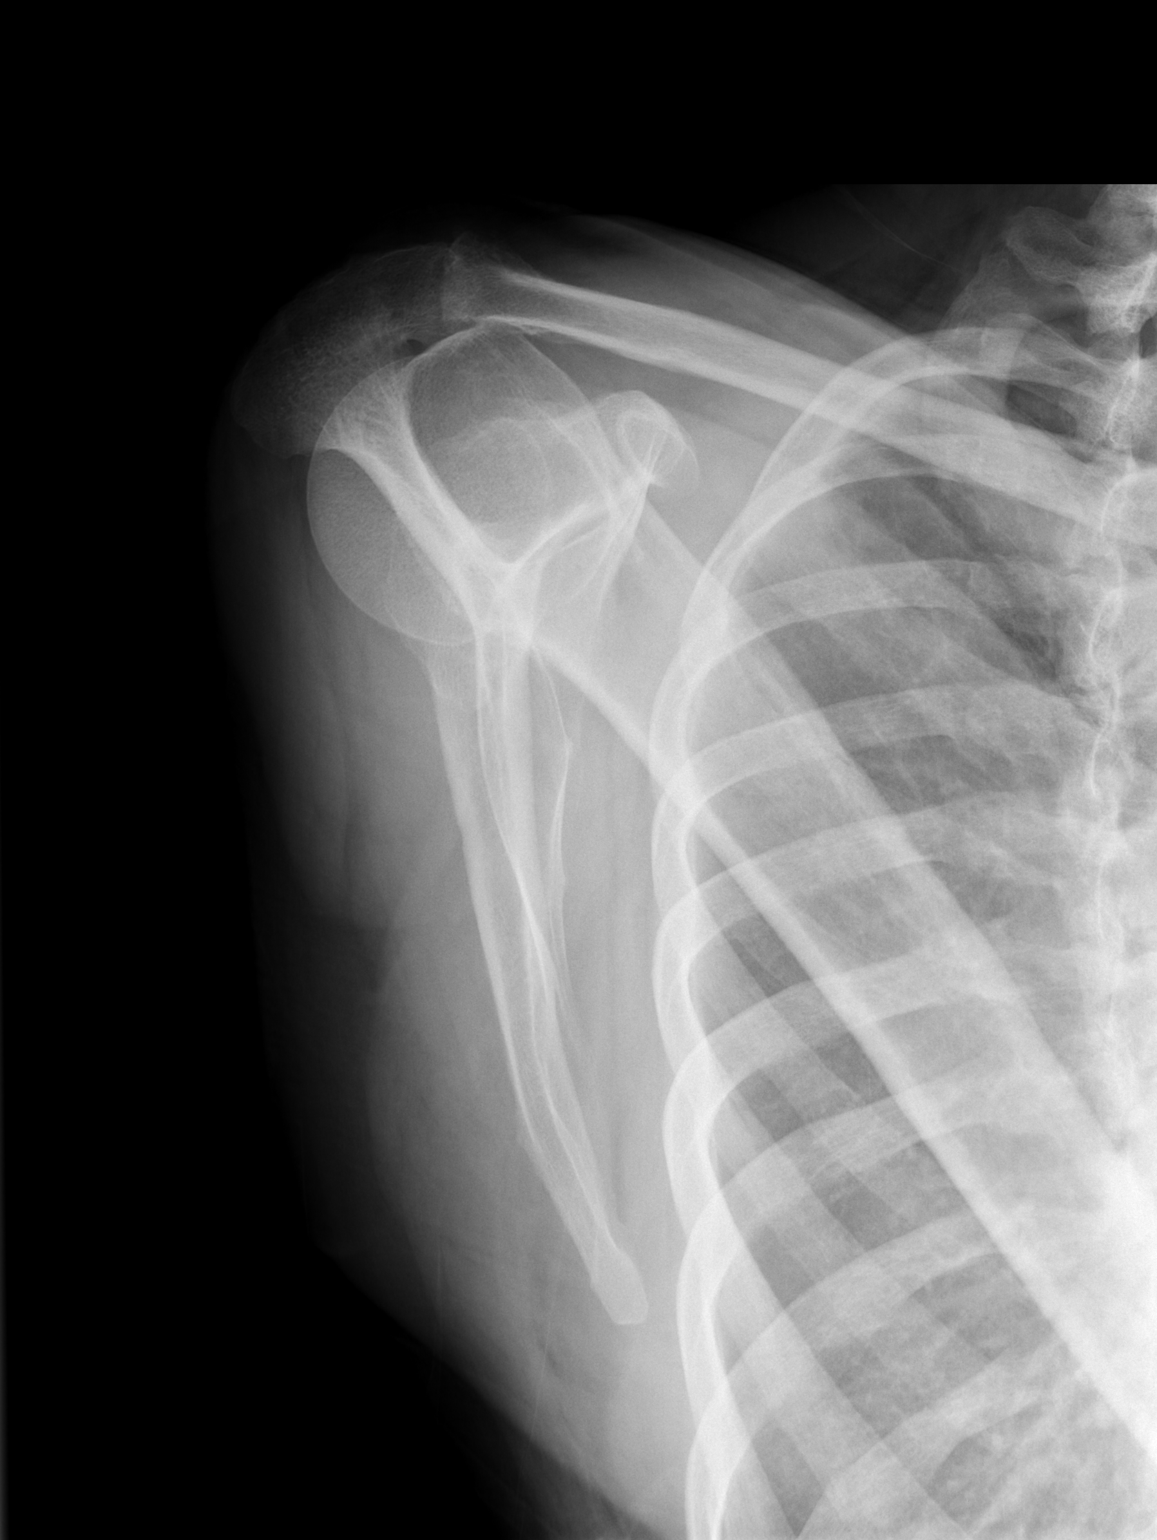

[w shoulder axillary right *]
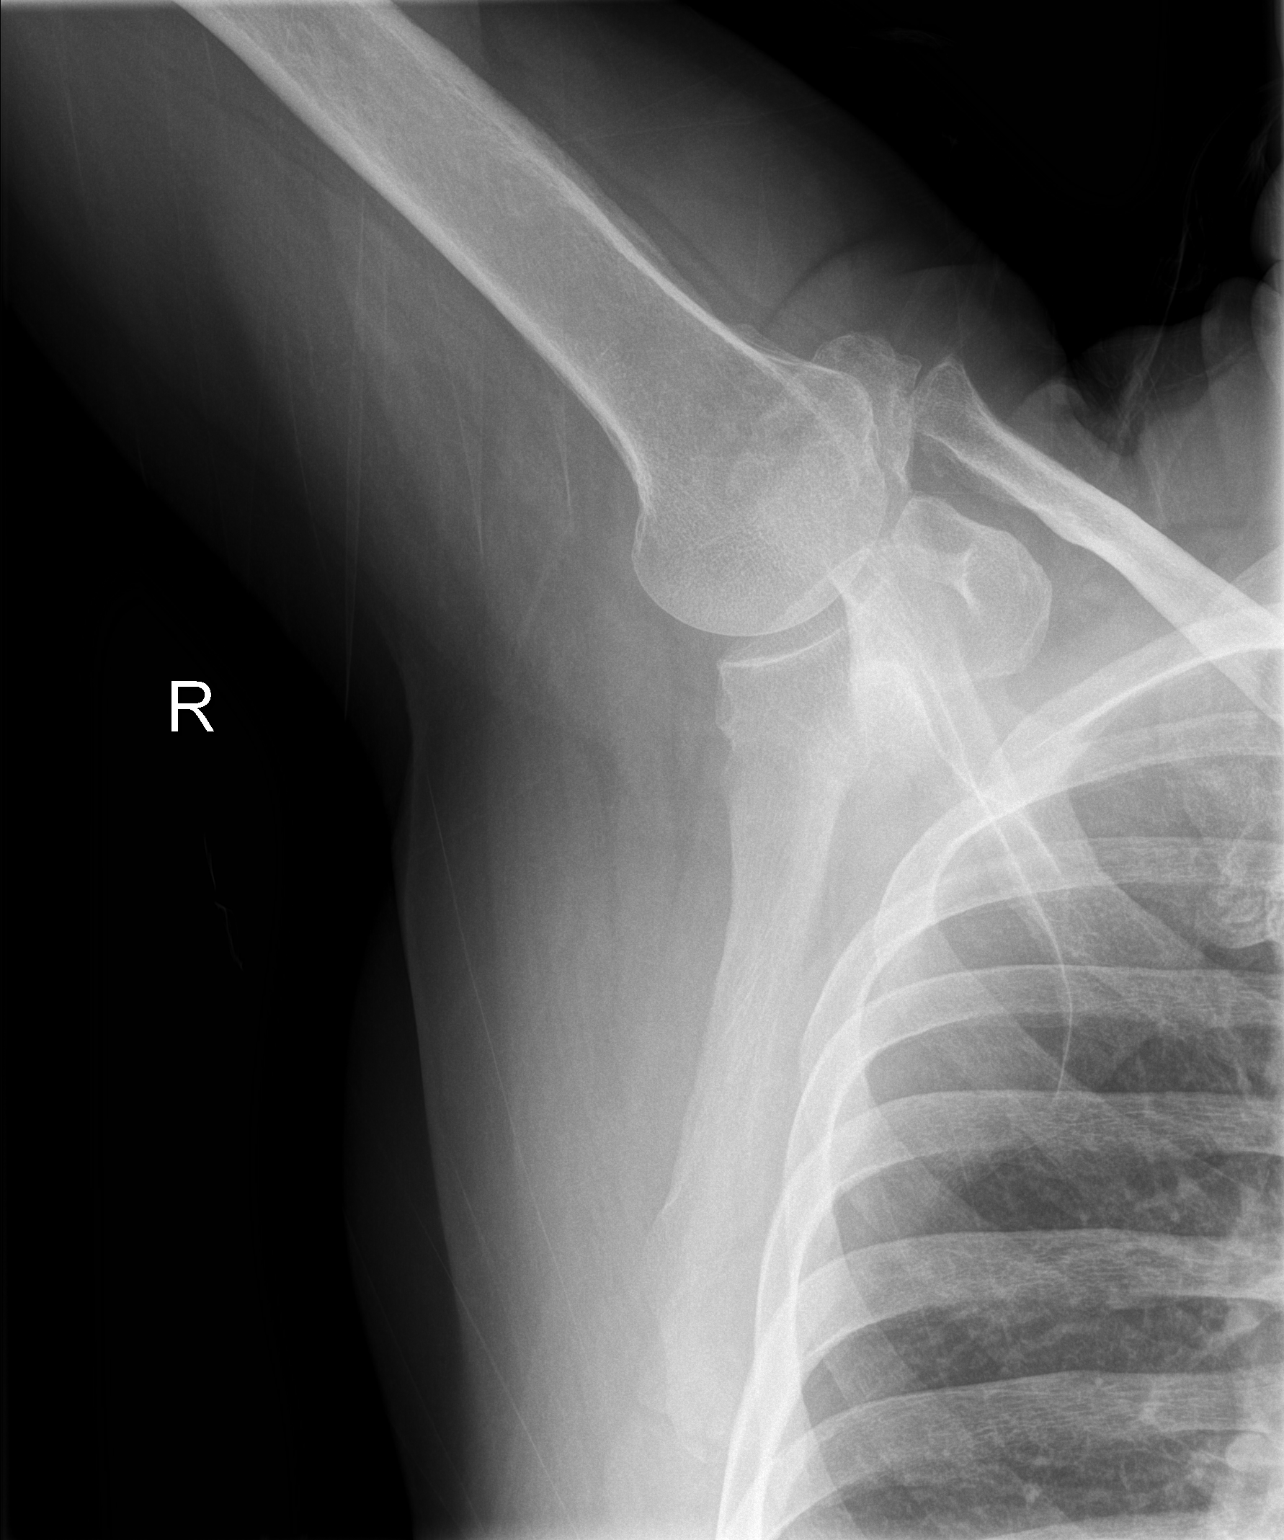

[3 of 3 positions shown; findings below may reference images not displayed]

FINDINGS: Degenerative changes of the acromioclavicular joint are noted. No
acute fracture or dislocation is seen. The underlying bony thorax is
within normal limits. No soft tissue abnormality is seen.
IMPRESSION: Degenerative change without acute abnormality.
